# Patient Record
Sex: Male | Born: 1955 | Race: White | Hispanic: No | Marital: Married | Smoking: Never smoker
Health system: Southern US, Community
[De-identification: ages and names within clinical notes are randomized; demographics above are authoritative.]

## PROBLEM LIST (undated history)

## (undated) DIAGNOSIS — E785 Hyperlipidemia, unspecified: Secondary | ICD-10-CM

## (undated) DIAGNOSIS — I10 Essential (primary) hypertension: Secondary | ICD-10-CM

## (undated) DIAGNOSIS — I214 Non-ST elevation (NSTEMI) myocardial infarction: Secondary | ICD-10-CM

---

## 2018-02-15 DIAGNOSIS — I2510 Atherosclerotic heart disease of native coronary artery without angina pectoris: Secondary | ICD-10-CM

## 2018-02-15 HISTORY — DX: Atherosclerotic heart disease of native coronary artery without angina pectoris: I25.10

## 2018-03-06 DIAGNOSIS — I2510 Atherosclerotic heart disease of native coronary artery without angina pectoris: Secondary | ICD-10-CM

## 2018-03-16 ENCOUNTER — Other Ambulatory Visit: Payer: Self-pay

## 2018-03-16 ENCOUNTER — Encounter (HOSPITAL_COMMUNITY)
Admission: EM | Disposition: A | Discharge status: Home / Self Care | Payer: Self-pay | Source: Home / Self Care | Attending: Cardiovascular Disease

## 2018-03-16 ENCOUNTER — Emergency Department (HOSPITAL_COMMUNITY): Payer: Managed Care, Other (non HMO)

## 2018-03-16 ENCOUNTER — Inpatient Hospital Stay (HOSPITAL_COMMUNITY)
Admission: EM | Admit: 2018-03-16 | Discharge: 2018-03-18 | DRG: 247 | Disposition: A | Discharge status: Home / Self Care | Payer: Managed Care, Other (non HMO) | Attending: Cardiovascular Disease | Admitting: Cardiovascular Disease

## 2018-03-16 ENCOUNTER — Encounter (HOSPITAL_COMMUNITY): Payer: Self-pay | Admitting: Emergency Medicine

## 2018-03-16 DIAGNOSIS — Z7982 Long term (current) use of aspirin: Secondary | ICD-10-CM

## 2018-03-16 DIAGNOSIS — I249 Acute ischemic heart disease, unspecified: Secondary | ICD-10-CM

## 2018-03-16 DIAGNOSIS — I214 Non-ST elevation (NSTEMI) myocardial infarction: Secondary | ICD-10-CM | POA: Diagnosis present

## 2018-03-16 DIAGNOSIS — I25119 Atherosclerotic heart disease of native coronary artery with unspecified angina pectoris: Secondary | ICD-10-CM | POA: Diagnosis present

## 2018-03-16 DIAGNOSIS — I2510 Atherosclerotic heart disease of native coronary artery without angina pectoris: Secondary | ICD-10-CM

## 2018-03-16 DIAGNOSIS — Z955 Presence of coronary angioplasty implant and graft: Secondary | ICD-10-CM

## 2018-03-16 DIAGNOSIS — I10 Essential (primary) hypertension: Secondary | ICD-10-CM | POA: Diagnosis present

## 2018-03-16 DIAGNOSIS — E785 Hyperlipidemia, unspecified: Secondary | ICD-10-CM

## 2018-03-16 DIAGNOSIS — Z79899 Other long term (current) drug therapy: Secondary | ICD-10-CM | POA: Diagnosis not present

## 2018-03-16 DIAGNOSIS — E7849 Other hyperlipidemia: Secondary | ICD-10-CM | POA: Diagnosis not present

## 2018-03-16 HISTORY — DX: Non-ST elevation (NSTEMI) myocardial infarction: I21.4

## 2018-03-16 HISTORY — DX: Hyperlipidemia, unspecified: E78.5

## 2018-03-16 HISTORY — DX: Essential (primary) hypertension: I10

## 2018-03-16 HISTORY — PX: LEFT HEART CATH AND CORONARY ANGIOGRAPHY: CATH118249

## 2018-03-16 HISTORY — PX: CORONARY/GRAFT ACUTE MI REVASCULARIZATION: CATH118305

## 2018-03-16 LAB — PROTIME-INR
INR: 0.99
PROTHROMBIN TIME: 13 s (ref 11.4–15.2)

## 2018-03-16 LAB — CBC
HCT: 47.1 % (ref 39.0–52.0)
Hemoglobin: 15.9 g/dL (ref 13.0–17.0)
MCH: 31.4 pg (ref 26.0–34.0)
MCHC: 33.8 g/dL (ref 30.0–36.0)
MCV: 92.9 fL (ref 78.0–100.0)
Platelets: 288 10*3/uL (ref 150–400)
RBC: 5.07 MIL/uL (ref 4.22–5.81)
RDW: 12.2 % (ref 11.5–15.5)
WBC: 11.8 10*3/uL — AB (ref 4.0–10.5)

## 2018-03-16 LAB — BASIC METABOLIC PANEL
ANION GAP: 9 (ref 5–15)
BUN: 14 mg/dL (ref 6–20)
CALCIUM: 9.7 mg/dL (ref 8.9–10.3)
CO2: 25 mmol/L (ref 22–32)
Chloride: 102 mmol/L (ref 98–111)
Creatinine, Ser: 1.06 mg/dL (ref 0.61–1.24)
Glucose, Bld: 122 mg/dL — ABNORMAL HIGH (ref 70–99)
Potassium: 4.4 mmol/L (ref 3.5–5.1)
Sodium: 136 mmol/L (ref 135–145)

## 2018-03-16 LAB — APTT: aPTT: 28 seconds (ref 24–36)

## 2018-03-16 LAB — LIPID PANEL
Cholesterol: 241 mg/dL — ABNORMAL HIGH (ref 0–200)
HDL: 75 mg/dL (ref >40)
LDL CALC: 153 mg/dL — AB (ref 0–99)
Total CHOL/HDL Ratio: 3.2 RATIO
Triglycerides: 67 mg/dL (ref <150)
VLDL: 13 mg/dL (ref 0–40)

## 2018-03-16 LAB — TROPONIN I

## 2018-03-16 LAB — I-STAT TROPONIN, ED: TROPONIN I, POC: 4.26 ng/mL — AB (ref 0.00–0.08)

## 2018-03-16 LAB — MRSA PCR SCREENING: MRSA by PCR: NEGATIVE

## 2018-03-16 SURGERY — CORONARY/GRAFT ACUTE MI REVASCULARIZATION
Anesthesia: LOCAL

## 2018-03-16 MED ORDER — SODIUM CHLORIDE 0.9 % IV SOLN
INTRAVENOUS | Status: AC | PRN
  Administered 2018-03-16 (×2): 1.75 mg/kg/h via INTRAVENOUS

## 2018-03-16 MED ORDER — ONDANSETRON HCL 4 MG/2ML IJ SOLN: 4.0000 mg | Freq: Four times a day (QID) | INTRAMUSCULAR | Status: DC | PRN

## 2018-03-16 MED ORDER — VERAPAMIL HCL 2.5 MG/ML IV SOLN
INTRAVENOUS | Status: DC | PRN
  Administered 2018-03-16: 10 mL via INTRA_ARTERIAL

## 2018-03-16 MED ORDER — ATORVASTATIN CALCIUM 80 MG PO TABS
80.0000 mg | ORAL_TABLET | Freq: Every day | ORAL | Status: DC
  Administered 2018-03-17: 80 mg via ORAL
  Filled 2018-03-16: qty 1

## 2018-03-16 MED ORDER — LISINOPRIL 2.5 MG PO TABS
2.5000 mg | ORAL_TABLET | Freq: Every day | ORAL | Status: DC
  Administered 2018-03-17 – 2018-03-18 (×2): 2.5 mg via ORAL
  Filled 2018-03-16 (×2): qty 1

## 2018-03-16 MED ORDER — FENTANYL CITRATE (PF) 100 MCG/2ML IJ SOLN
INTRAMUSCULAR | Status: DC | PRN
  Administered 2018-03-16: 25 ug via INTRAVENOUS

## 2018-03-16 MED ORDER — BIVALIRUDIN BOLUS VIA INFUSION - CUPID
INTRAVENOUS | Status: DC | PRN
  Administered 2018-03-16: 62.925 mg via INTRAVENOUS

## 2018-03-16 MED ORDER — ATROPINE SULFATE 1 MG/10ML IJ SOSY
PREFILLED_SYRINGE | INTRAMUSCULAR | Status: DC | PRN
  Administered 2018-03-16: 0.5 mg via INTRAVENOUS

## 2018-03-16 MED ORDER — IOHEXOL 350 MG/ML SOLN
INTRAVENOUS | Status: DC | PRN
  Administered 2018-03-16: 65 mL via INTRA_ARTERIAL

## 2018-03-16 MED ORDER — NITROGLYCERIN 1 MG/10 ML FOR IR/CATH LAB
INTRA_ARTERIAL | Status: AC
  Filled 2018-03-16: qty 10

## 2018-03-16 MED ORDER — ASPIRIN 81 MG PO CHEW
324.0000 mg | CHEWABLE_TABLET | Freq: Once | ORAL | Status: AC
  Administered 2018-03-16: 324 mg via ORAL
  Filled 2018-03-16: qty 4

## 2018-03-16 MED ORDER — HEPARIN (PORCINE) IN NACL 1000-0.9 UT/500ML-% IV SOLN
INTRAVENOUS | Status: AC
  Filled 2018-03-16: qty 1000

## 2018-03-16 MED ORDER — HEPARIN (PORCINE) IN NACL 100-0.45 UNIT/ML-% IJ SOLN
1100.0000 [IU]/h | INTRAMUSCULAR | Status: DC
  Administered 2018-03-16: 1100 [IU]/h via INTRAVENOUS
  Filled 2018-03-16: qty 250

## 2018-03-16 MED ORDER — ACETAMINOPHEN 325 MG PO TABS: 650.0000 mg | ORAL_TABLET | ORAL | Status: DC | PRN

## 2018-03-16 MED ORDER — SODIUM CHLORIDE 0.9% FLUSH
3.0000 mL | Freq: Two times a day (BID) | INTRAVENOUS | Status: DC
  Administered 2018-03-17: 3 mL via INTRAVENOUS

## 2018-03-16 MED ORDER — MIDAZOLAM HCL 2 MG/2ML IJ SOLN
INTRAMUSCULAR | Status: DC | PRN
  Administered 2018-03-16: 2 mg via INTRAVENOUS

## 2018-03-16 MED ORDER — TICAGRELOR 90 MG PO TABS
90.0000 mg | ORAL_TABLET | Freq: Two times a day (BID) | ORAL | Status: DC
  Administered 2018-03-17 – 2018-03-18 (×3): 90 mg via ORAL
  Filled 2018-03-16 (×3): qty 1

## 2018-03-16 MED ORDER — SODIUM CHLORIDE 0.9 % IV SOLN
1.7500 mg/kg/h | INTRAVENOUS | Status: AC
  Filled 2018-03-16: qty 250

## 2018-03-16 MED ORDER — VERAPAMIL HCL 2.5 MG/ML IV SOLN
INTRAVENOUS | Status: AC
  Filled 2018-03-16: qty 2

## 2018-03-16 MED ORDER — ASPIRIN 81 MG PO CHEW
81.0000 mg | CHEWABLE_TABLET | Freq: Every day | ORAL | Status: DC
  Administered 2018-03-16 – 2018-03-18 (×3): 81 mg via ORAL
  Filled 2018-03-16 (×3): qty 1

## 2018-03-16 MED ORDER — LIDOCAINE HCL (PF) 1 % IJ SOLN
INTRAMUSCULAR | Status: AC
  Filled 2018-03-16: qty 30

## 2018-03-16 MED ORDER — SODIUM CHLORIDE 0.9 % IV SOLN
INTRAVENOUS | Status: DC
  Administered 2018-03-16: 20 mL/h via INTRAVENOUS

## 2018-03-16 MED ORDER — HEPARIN (PORCINE) IN NACL 2-0.9 UNITS/ML
INTRAMUSCULAR | Status: AC | PRN
  Administered 2018-03-16 (×2): 500 mL

## 2018-03-16 MED ORDER — LABETALOL HCL 5 MG/ML IV SOLN: 10.0000 mg | INTRAVENOUS | Status: AC | PRN

## 2018-03-16 MED ORDER — HYDRALAZINE HCL 20 MG/ML IJ SOLN: 5.0000 mg | INTRAMUSCULAR | Status: AC | PRN

## 2018-03-16 MED ORDER — SODIUM CHLORIDE 0.9 % IV SOLN: 250.0000 mL | INTRAVENOUS | Status: DC | PRN

## 2018-03-16 MED ORDER — SODIUM CHLORIDE 0.9 % IV SOLN
INTRAVENOUS | Status: DC
  Administered 2018-03-17: 06:00:00 via INTRAVENOUS

## 2018-03-16 MED ORDER — DIAZEPAM 5 MG PO TABS: 5.0000 mg | ORAL_TABLET | Freq: Four times a day (QID) | ORAL | Status: DC | PRN

## 2018-03-16 MED ORDER — BIVALIRUDIN TRIFLUOROACETATE 250 MG IV SOLR
INTRAVENOUS | Status: AC
  Filled 2018-03-16: qty 250

## 2018-03-16 MED ORDER — IOPAMIDOL (ISOVUE-370) INJECTION 76%
INTRAVENOUS | Status: DC | PRN
  Administered 2018-03-16: 125 mL via INTRA_ARTERIAL

## 2018-03-16 MED ORDER — HEPARIN BOLUS VIA INFUSION
4000.0000 [IU] | Freq: Once | INTRAVENOUS | Status: AC
Start: 2018-03-16 — End: 2018-03-16
  Administered 2018-03-16: 4000 [IU] via INTRAVENOUS
  Filled 2018-03-16: qty 4000

## 2018-03-16 MED ORDER — FENTANYL CITRATE (PF) 100 MCG/2ML IJ SOLN
INTRAMUSCULAR | Status: AC
  Filled 2018-03-16: qty 2

## 2018-03-16 MED ORDER — MIDAZOLAM HCL 2 MG/2ML IJ SOLN
INTRAMUSCULAR | Status: AC
  Filled 2018-03-16: qty 2

## 2018-03-16 MED ORDER — LIDOCAINE HCL (PF) 1 % IJ SOLN
INTRAMUSCULAR | Status: DC | PRN
  Administered 2018-03-16: 5 mL via SUBCUTANEOUS

## 2018-03-16 MED ORDER — NITROGLYCERIN 1 MG/10 ML FOR IR/CATH LAB
INTRA_ARTERIAL | Status: DC | PRN
  Administered 2018-03-16: 100 ug

## 2018-03-16 MED ORDER — NITROGLYCERIN IN D5W 200-5 MCG/ML-% IV SOLN
0.0000 ug/min | Freq: Once | INTRAVENOUS | Status: AC
  Administered 2018-03-16: 5 ug/min via INTRAVENOUS
  Filled 2018-03-16: qty 250

## 2018-03-16 MED ORDER — SODIUM CHLORIDE 0.9% FLUSH: 3.0000 mL | INTRAVENOUS | Status: DC | PRN

## 2018-03-16 MED ORDER — NITROGLYCERIN 0.4 MG SL SUBL: 0.4000 mg | SUBLINGUAL_TABLET | SUBLINGUAL | Status: DC | PRN

## 2018-03-16 MED ORDER — TICAGRELOR 90 MG PO TABS
ORAL_TABLET | ORAL | Status: DC | PRN
  Administered 2018-03-16: 180 mg via ORAL

## 2018-03-16 MED ORDER — TICAGRELOR 90 MG PO TABS
ORAL_TABLET | ORAL | Status: AC
  Filled 2018-03-16: qty 2

## 2018-03-16 SURGICAL SUPPLY — 17 items
BALLN SAPPHIRE 2.0X12 (BALLOONS) ×2
BALLN ~~LOC~~ EMERGE MR 2.75X12 (BALLOONS) ×2
BALLOON SAPPHIRE 2.0X12 (BALLOONS) ×1 IMPLANT
BALLOON ~~LOC~~ EMERGE MR 2.75X12 (BALLOONS) ×1 IMPLANT
CATH OPTITORQUE TIG 4.0 5F (CATHETERS) ×2 IMPLANT
CATH VISTA GUIDE 6FR JR4 (CATHETERS) ×2 IMPLANT
DEVICE RAD COMP TR BAND LRG (VASCULAR PRODUCTS) ×2 IMPLANT
GLIDESHEATH SLEND SS 6F .021 (SHEATH) ×2 IMPLANT
GUIDEWIRE INQWIRE 1.5J.035X260 (WIRE) ×1 IMPLANT
INQWIRE 1.5J .035X260CM (WIRE) ×2
KIT ENCORE 26 ADVANTAGE (KITS) ×2 IMPLANT
KIT HEART LEFT (KITS) ×2 IMPLANT
PACK CARDIAC CATHETERIZATION (CUSTOM PROCEDURE TRAY) ×2 IMPLANT
STENT RESOLUTE ONYX 2.5X15 (Permanent Stent) ×2 IMPLANT
TRANSDUCER W/STOPCOCK (MISCELLANEOUS) ×2 IMPLANT
TUBING CIL FLEX 10 FLL-RA (TUBING) ×2 IMPLANT
WIRE PT2 MS 185 (WIRE) ×2 IMPLANT

## 2018-03-16 NOTE — ED Notes (Signed)
CRITICAL I-STAT RESULTS :   I-stat Troponin: 4.26  Resulted to: Rubin PayorPickering, MD

## 2018-03-16 NOTE — Progress Notes (Signed)
Chaplain responded to A code Stemi in the ED.  The patient is being evaluated by the medical staff at this time. Family is present. Chaplain will follow up as needed. Chaplain Janell QuietAudrey Vincentina Sollers [phone]

## 2018-03-16 NOTE — ED Triage Notes (Signed)
Pt reports intermittent chest pain for several days with activity. Also has shortness of breath that comes and goes. Pt states when he has been exercising he has been yawning which is not normal for him. Pt also reports one episode of body chills. Pt endorses "abnormal random cough" today as well.

## 2018-03-16 NOTE — ED Provider Notes (Signed)
[facility] Provider Note   CSN: [id number] Arrival date & time: 03/16/18  1646     History   Chief Complaint Chief Complaint  Patient presents with  . Chest Pain    HPI Elijah Baker is a 62 y.o. male.  HPI Patient presents with chest pain.  Has been going for most of the day today.  It is dull and tight in his left chest.  Came on when he got up and walked this morning.  Was here for hours and improved a little with rest but has returned.  He is an avid bicyclist and over the last week has not been able to bike the same.  Has some mild chest pain would come on with it and he was not able to do the same level of exertion he normally would.  Rare cough.  No swelling in his legs.  No history of cardiac disease.  Continued pain now.  Took Motrin without relief. Past Medical History:  Diagnosis Date  . Hypertension     There are no active problems to display for this patient.         Home Medications    Prior to Admission medications   Not on File    Family History No family history on file.  Social History Social History   Tobacco Use  . Smoking status: Not on file  Substance Use Topics  . Alcohol use: Not on file  . Drug use: Not on file     Allergies   Azithromycin; Bee venom; Ciprofloxacin; and Metronidazole   Review of Systems Review of Systems  Constitutional: Negative for appetite change.  HENT: Negative for congestion.   Respiratory: Positive for shortness of breath.   Cardiovascular: Positive for chest pain. Negative for leg swelling.  Gastrointestinal: Negative for abdominal pain.  Genitourinary: Negative for flank pain.  Musculoskeletal: Negative for back pain.  Skin: Negative for rash.  Neurological: Negative for syncope.  Hematological: Negative for adenopathy.     Physical Exam Updated Vital Signs BP 109/64 (BP Location: Right Arm)   Pulse (!) 50   Temp 98.4 F (36.9 C)   Resp (!) 9    Ht 6\' 2"  (1.88 m)   Wt 83.9 kg (185 lb)   SpO2 95%   BMI 23.75 kg/m   Physical Exam  Constitutional: He appears well-developed.  HENT:  Head: Normocephalic.  Eyes: EOM are normal.  Cardiovascular: Normal rate, regular rhythm and normal pulses.  Pulmonary/Chest: Effort normal.  Abdominal: Soft. There is no tenderness.  Musculoskeletal:       Right lower leg: He exhibits no edema.       Left lower leg: He exhibits no edema.  Skin: Skin is warm. Capillary refill takes less than 2 seconds.     ED Treatments / Results  Labs (all labs ordered are listed, but only abnormal results are displayed) Labs Reviewed  BASIC METABOLIC PANEL - Abnormal; Notable for the following components:      Result Value   Glucose, Bld 122 (*)    All other components within normal limits  CBC - Abnormal; Notable for the following components:   WBC 11.8 (*)    All other components within normal limits  LIPID PANEL - Abnormal; Notable for the following components:   Cholesterol 241 (*)    LDL Cholesterol 153 (*)    All other components within normal limits  I-STAT TROPONIN, ED - Abnormal; Notable for the following components:  Troponin i, poc 4.26 (*)    All other components within normal limits  PROTIME-INR  APTT  HEPARIN LEVEL (UNFRACTIONATED)  CBC  I-STAT BETA HCG BLOOD, ED ([facility], [facility], [facility] ONLY)    EKG EKG Interpretation  Date/Time:  Sunday March 16 2018 16:49:31 EDT Ventricular Rate:  63 PR Interval:  170 QRS Duration: 88 QT Interval:  372 QTC Calculation: 380 R Axis:   72 Text Interpretation:  Normal sinus rhythm Possible Anterior infarct , age undetermined T wave abnormality, consider inferolateral ischemia Abnormal ECG No old tracing to compare Confirmed by Benjiman Core [phone]) on 03/16/2018 5:28:24 PM   Radiology Dg Chest 2 View  Result Date: 03/16/2018 CLINICAL DATA:  Intermittent chest pain for the past few days with shortness of breath. EXAM: CHEST - 2 VIEW COMPARISON:   None. FINDINGS: The heart size and mediastinal contours are within normal limits. Both lungs are clear. The visualized skeletal structures are unremarkable. IMPRESSION: No active cardiopulmonary disease. Electronically Signed   By: Obie Dredge M.D.   On: 03/16/2018 17:48    Procedures Procedures (including critical care time)  Medications Ordered in ED Medications  0.9 %  sodium chloride infusion (20 mL/hr Intravenous New Bag/Given 03/16/18 1810)  nitroGLYCERIN (NITROSTAT) SL tablet 0.4 mg (has no administration in time range)  heparin ADULT infusion 100 units/mL (25000 units/230mL sodium chloride 0.45%) (1,100 Units/hr Intravenous New Bag/Given 03/16/18 1844)  aspirin chewable tablet 324 mg (324 mg Oral Given 03/16/18 1814)  nitroGLYCERIN 50 mg in dextrose 5 % 250 mL (0.2 mg/mL) infusion (30 mcg/min Intravenous Rate/Dose Change 03/16/18 1905)  heparin bolus via infusion 4,000 Units (4,000 Units Intravenous Bolus from Bag 03/16/18 1842)     Initial Impression / Assessment and Plan / ED Course  I have reviewed the triage vital signs and the nursing notes.  Pertinent labs & imaging results that were available during my care of the patient were reviewed by me and considered in my medical decision making (see chart for details).     Patient with chest pain.  Story worrisome for an unstable angina but then his troponin came back at 4.  Discussed with cardiology, who came to see the patient.  Continued pain with nitroglycerin and aspirin.  EKG stable on repeat.  Will likely require either urgent or emergent heart catheterization.  CRITICAL CARE Performed by: Benjiman Core Total critical care time: 30 minutes Critical care time was exclusive of separately billable procedures and treating other patients. Critical care was necessary to treat or prevent imminent or life-threatening deterioration. Critical care was time spent personally by me on the following activities: development of  treatment plan with patient and/or surrogate as well as nursing, discussions with consultants, evaluation of patient's response to treatment, examination of patient, obtaining history from patient or surrogate, ordering and performing treatments and interventions, ordering and review of laboratory studies, ordering and review of radiographic studies, pulse oximetry and re-evaluation of patient's condition.   Final Clinical Impressions(s) / ED Diagnoses   Final diagnoses:  NSTEMI (non-ST elevated myocardial infarction) [facility])    ED Discharge Orders    None       Benjiman Core, MD 03/16/18 1916

## 2018-03-16 NOTE — Progress Notes (Signed)
ANTICOAGULATION CONSULT NOTE - Initial Consult  Pharmacy Consult for heparin Indication: chest pain/ACS  Allergies  Allergen Reactions  . Azithromycin   . Bee Venom   . Ciprofloxacin   . Metronidazole     Patient Measurements: Height: 6\' 2"  (188 cm) Weight: 185 lb (83.9 kg) IBW/kg (Calculated) : 77.7 Heparin Dosing Weight: 83.9 kg  Vital Signs: Temp: 98.4 F (36.9 C) (06/30 1658) BP: 159/81 (06/30 1658) Pulse Rate: 64 (06/30 1658)  Labs: Recent Labs    03/16/18 1713  HGB 15.9  HCT 47.1  PLT 288    Assessment: Heparin gtt for rule out ACS Pt admitted with intermittent chest pain and SOB Trop 4.2 SCr 1   Goal of Therapy:  Heparin level 0.3-0.7 units/ml Monitor platelets by anticoagulation protocol: Yes    Plan:  -Heparin 4000 units x1 then 1100 units/hr -Daily HL, CBC -First level with AM labs   Elijah Baker, Elijah HouseholderAlison M 03/16/2018,5:52 PM

## 2018-03-16 NOTE — H&P (Addendum)
History and Physical  Primary Cardiologist: N/A PCP: No primary care provider on file.  Chief Complaint: Chest Pain  HPI:  This is a 62 year old male without significant past medical history other than hypertension who presents with chest pain.  He is an avid bike rider, riding multiple times a week.  Over the last few weeks he is noticed progressive decrease in exercise tolerance.  This morning at 6 AM he woke up and while walking to get coffee noticed excruciating acute onset chest pain.  Throughout the entire day it has increased and decreased in severity but never completely left.  He presented to the [facility] ED this afternoon/evening and had inferior T wave inversions on his EKG.  First troponin was roughly 5.  Normal kidney function.  Upon interview he continues to have angina.  This has continued despite uptitrating nitroglycerin infusion to 35 mcg.  He is status post heparin and full strength aspirin.  No significant family medical history of cardiovascular disease.  He is a non-smoker.  No associated review of systems which are positive including nausea vomiting, shortness of breath, bleeding or bruising troubles or any other significant findings.  Prior Cardiac Studies:  Past Medical History:  Diagnosis Date  . Hypertension     History reviewed. No pertinent surgical history.  History reviewed. No pertinent family history. Social History:  has no tobacco, alcohol, and drug history on file.  Allergies:  Allergies  Allergen Reactions  . Azithromycin   . Bee Venom   . Ciprofloxacin   . Metronidazole     No current facility-administered medications on file prior to encounter.    No current outpatient medications on file prior to encounter.    _0 @ _1 @  Results for orders placed or performed during the hospital encounter of 03/16/18 (from the past 48 hour(s))  Basic metabolic panel     Status: Abnormal   Collection Time: 03/16/18  5:13 PM    Result Value Ref Range   Sodium 136 135 - 145 mmol/L   Potassium 4.4 3.5 - 5.1 mmol/L   Chloride 102 98 - 111 mmol/L    Comment: Please note change in reference range.   CO2 25 22 - 32 mmol/L   Glucose, Bld 122 (H) 70 - 99 mg/dL    Comment: Please note change in reference range.   BUN 14 6 - 20 mg/dL    Comment: Please note change in reference range.   Creatinine, Ser 1.06 0.61 - 1.24 mg/dL   Calcium 9.7 8.9 - 10.3 mg/dL   GFR calc non Af Amer >60 >60 mL/min   GFR calc Af Amer >60 >60 mL/min    Comment: (NOTE) The eGFR has been calculated using the CKD EPI equation. This calculation has not been validated in all clinical situations. eGFR's persistently <60 mL/min signify possible Chronic Kidney Disease.    Anion gap 9 5 - 15    Comment: Performed at [facility] [address], [place], [place] [address]  CBC     Status: Abnormal   Collection Time: 03/16/18  5:13 PM  Result Value Ref Range   WBC 11.8 (H) 4.0 - 10.5 K/uL   RBC 5.07 4.22 - 5.81 MIL/uL   Hemoglobin 15.9 13.0 - 17.0 g/dL   HCT 47.1 39.0 - 52.0 %   MCV 92.9 78.0 - 100.0 fL   MCH 31.4 26.0 - 34.0 pg   MCHC 33.8 30.0 - 36.0 g/dL   RDW 12.2 11.5 - 15.5 %  Platelets 288 150 - 400 K/uL    Comment: Performed at [facility], [place] [address], [place], [place] [address]  I-stat troponin, ED     Status: Abnormal   Collection Time: 03/16/18  5:26 PM  Result Value Ref Range   Troponin i, poc 4.26 (HH) 0.00 - 0.08 ng/mL   Comment NOTIFIED PHYSICIAN    Comment 3            Comment: Due to the release kinetics of cTnI, a negative result within the first hours of the onset of symptoms does not rule out myocardial infarction with certainty. If myocardial infarction is still suspected, repeat the test at appropriate intervals.   Protime-INR     Status: None   Collection Time: 03/16/18  5:53 PM  Result Value Ref Range   Prothrombin Time 13.0 11.4 - 15.2 seconds   INR 0.99     Comment: Performed at [facility], [place] [address] [place], [place] [address]  APTT     Status: None   Collection Time: 03/16/18  5:53 PM  Result Value Ref Range   aPTT 28 24 - 36 seconds    Comment: Performed at [facility] [address], [place], [place] [address]  Lipid panel     Status: Abnormal   Collection Time: 03/16/18  5:53 PM  Result Value Ref Range   Cholesterol 241 (H) 0 - 200 mg/dL   Triglycerides 67 <150 mg/dL   HDL 75 >40 mg/dL   Total CHOL/HDL Ratio 3.2 RATIO   VLDL 13 0 - 40 mg/dL   LDL Cholesterol 153 (H) 0 - 99 mg/dL    Comment:        Total Cholesterol/HDL:CHD Risk Coronary Heart Disease Risk Table                     Men   Women  1/2 Average Risk   3.4   3.3  Average Risk       5.0   4.4  2 X Average Risk   9.6   7.1  3 X Average Risk  23.4   11.0        Use the calculated Patient Ratio above and the CHD Risk Table to determine the patient's CHD Risk.        ATP III CLASSIFICATION (LDL):  <100     mg/dL   Optimal  100-129  mg/dL   Near or Above                    Optimal  130-159  mg/dL   Borderline  160-189  mg/dL   High  >190     mg/dL   Very High Performed at [facility] [address], [place], [place] [address]    Dg Chest 2 View  Result Date: 03/16/2018 CLINICAL DATA:  Intermittent chest pain for the past few days with shortness of breath. EXAM: CHEST - 2 VIEW COMPARISON:  None. FINDINGS: The heart size and mediastinal contours are within normal limits. Both lungs are clear. The visualized skeletal structures are unremarkable. IMPRESSION: No active cardiopulmonary disease. Electronically Signed   By: Titus Dubin M.D.   On: 03/16/2018 17:48    ECG/Tele: NSR with TWI inferolaterally  ROS: As above. Otherwise, review of systems is negative unless per above HPI  Vitals:   03/16/18 1658 03/16/18 1821 03/16/18 1829 03/16/18 1855  BP: (!) 159/81 (!) 160/83 Marland Kitchen)  146/88 109/64  Pulse: 64 (!) 58 62 (!) 50  Resp: _0 (!) 9  Temp: 98.4 F (36.9  C)     SpO2: 99% 100% 96% 95%  Weight:      Height:       Wt Readings from Last 10 Encounters:  03/16/18 83.9 kg (185 lb)    PE:  General: No acute distress HEENT: Atraumatic, EOMI, mucous membranes moist. No JVD at 45 degrees. No HJR. CV: RRR no murmurs, gallops.  Respiratory: Clear, no crackles. Normal work of breathing ABD: Non-distended and non-tender. No palpable organomegaly.  Extremities: 2+ radial pulses bilaterally. no edema. Neuro/Psych: CN grossly intact, alert and oriented  Assessment/Plan NSTEMI, high risk HTN  NSTEMI --pain has been refractory to IV nitroglycerin infusion.  Most recent blood pressure is 938 systolic and he continues to have angina.  I have discussed the case with interventional on-call team who will come in to perform an urgent heart catheterization. - S/P 325 ASA, IV UF Heparin - TTE - Tele bed, step down v ICU pending cath findings - AM lipids - ? Beta Blocker pending hemodynamics -  Likely Atorva 80  HTN - Likely additional BB as above, continue home BP meds  Full Code  Lolita Cram Means  MD 03/16/2018, 7:27 PM    Patient seen and examined. Agree with assessment and plan.  Elijah Baker is a very pleasant Caucasian male who has a history of hypertension and borderline mild hyperlipidemia.  Remotely he had taken statins but primary physician stopped these approximately 5 years ago.  The patient exercises regularly.  He is an avid cyclist and bikes hard at least 3 to 4 days/week.  This past week was the first time he began to notice some mild shortness of breath during some of his hard bike rides which he had never experienced before.  This morning at approximately 6 AM he developed new onset chest discomfort.  His chest pain persisted and waxed and waned in intensity throughout the day but ultimately continue to persist leading to his emergency room evaluation.  Initial ECG shows inferolateral T wave abnormality suggestive of  inferolateral ischemia.  Initial troponin is positive at 4.26.  He has continued to experience chest pain in the emergency room despite increasing doses of nitroglycerin and he was started on heparin.  Due to persistent discomfort he is now brought urgently to the cardiac catheterization laboratory.  Appears euvolemic on exam with no overt signs of heart failure.  He is mildly bradycardic still has residual 3/10 chest discomfort.  I discussed the catheterization procedure and high likelihood for intervention with the patient who agrees to undergo the procedure.  Troy Sine, MD, [facility] 03/16/2018 10:20 PM

## 2018-03-16 NOTE — Progress Notes (Signed)
CRITICAL VALUE ALERT  Critical Value:  Trop I > 65  Date & Time Notied:  t 2355  Provider Notified: Dr Charlestine NightMeans Cards fellow  Orders Received/Actions taken: No orders at this time

## 2018-03-17 ENCOUNTER — Inpatient Hospital Stay (HOSPITAL_COMMUNITY): Payer: Managed Care, Other (non HMO)

## 2018-03-17 ENCOUNTER — Other Ambulatory Visit: Payer: Self-pay | Admitting: Interventional Cardiology

## 2018-03-17 ENCOUNTER — Encounter (HOSPITAL_COMMUNITY): Payer: Self-pay | Admitting: Cardiovascular Disease

## 2018-03-17 DIAGNOSIS — I2510 Atherosclerotic heart disease of native coronary artery without angina pectoris: Secondary | ICD-10-CM

## 2018-03-17 DIAGNOSIS — I214 Non-ST elevation (NSTEMI) myocardial infarction: Secondary | ICD-10-CM

## 2018-03-17 DIAGNOSIS — E785 Hyperlipidemia, unspecified: Secondary | ICD-10-CM

## 2018-03-17 DIAGNOSIS — I10 Essential (primary) hypertension: Secondary | ICD-10-CM

## 2018-03-17 LAB — BASIC METABOLIC PANEL
Anion gap: 9 (ref 5–15)
BUN: 10 mg/dL (ref 6–20)
CALCIUM: 8.8 mg/dL — AB (ref 8.9–10.3)
CHLORIDE: 105 mmol/L (ref 98–111)
CO2: 25 mmol/L (ref 22–32)
CREATININE: 1.07 mg/dL (ref 0.61–1.24)
GFR calc Af Amer: >60 mL/min (ref >60)
GFR calc non Af Amer: >60 mL/min (ref >60)
Glucose, Bld: 112 mg/dL — ABNORMAL HIGH (ref 70–99)
Potassium: 4.2 mmol/L (ref 3.5–5.1)
SODIUM: 139 mmol/L (ref 135–145)

## 2018-03-17 LAB — CBC
HEMATOCRIT: 40.9 % (ref 39.0–52.0)
HEMOGLOBIN: 13.9 g/dL (ref 13.0–17.0)
MCH: 31.7 pg (ref 26.0–34.0)
MCHC: 34 g/dL (ref 30.0–36.0)
MCV: 93.4 fL (ref 78.0–100.0)
Platelets: 228 10*3/uL (ref 150–400)
RBC: 4.38 MIL/uL (ref 4.22–5.81)
RDW: 12.3 % (ref 11.5–15.5)
WBC: 11.1 10*3/uL — ABNORMAL HIGH (ref 4.0–10.5)

## 2018-03-17 LAB — ECHOCARDIOGRAM COMPLETE
HEIGHTINCHES: 74 in
WEIGHTICAEL: 2960 [oz_av]

## 2018-03-17 LAB — HEPATIC FUNCTION PANEL
ALBUMIN: 3.5 g/dL (ref 3.5–5.0)
ALK PHOS: 35 U/L — AB (ref 38–126)
ALT: 50 U/L — ABNORMAL HIGH (ref 0–44)
AST: 194 U/L — ABNORMAL HIGH (ref 15–41)
BILIRUBIN INDIRECT: 0.8 mg/dL (ref 0.3–0.9)
BILIRUBIN TOTAL: 1 mg/dL (ref 0.3–1.2)
Bilirubin, Direct: 0.2 mg/dL (ref 0.0–0.2)
TOTAL PROTEIN: 5.4 g/dL — AB (ref 6.5–8.1)

## 2018-03-17 LAB — POCT ACTIVATED CLOTTING TIME: Activated Clotting Time: 625 seconds

## 2018-03-17 MED FILL — Heparin Sod (Porcine)-NaCl IV Soln 1000 Unit/500ML-0.9%: INTRAVENOUS | Qty: 500 | Status: AC

## 2018-03-17 NOTE — Progress Notes (Signed)
Per insurance check on Brilinta  Co-pay amount for Brilinta 90 mg.BID is: $25.00 for 30 day supply.  No PA required  Pharmacies are : [facility], [facility].

## 2018-03-17 NOTE — Plan of Care (Signed)
  Problem: Clinical Measurements: Goal: Will remain free from infection Outcome: Progressing   Problem: Clinical Measurements: Goal: Respiratory complications will improve Outcome: Progressing   Problem: Activity: Goal: Risk for activity intolerance will decrease Outcome: Progressing   Problem: Nutrition: Goal: Adequate nutrition will be maintained Outcome: Progressing   Problem: Coping: Goal: Level of anxiety will decrease Outcome: Progressing   Problem: Elimination: Goal: Will not experience complications related to urinary retention Outcome: Progressing   Problem: Pain Managment: Goal: General experience of comfort will improve Outcome: Progressing   Problem: Safety: Goal: Ability to remain free from injury will improve Outcome: Progressing

## 2018-03-17 NOTE — Progress Notes (Signed)
  Echocardiogram 2D Echocardiogram has been performed.  Elijah Baker 03/17/2018, 10:30 AM

## 2018-03-17 NOTE — Progress Notes (Signed)
CARDIAC REHAB PHASE I   PRE:  Rate/Rhythm: 70 SR  BP:  Supine: 125/70  Sitting:   Standing:    SaO2: 96%RA  MODE:  Ambulation: 370 ft   POST:  Rate/Rhythm: 74 SR  BP:  Supine:   Sitting: 115/67  Standing:    SaO2: 95%RA 1045-1140 Pt walked 370 ft on RA with steady gait and no CP. Tolerated well. MI education completed with pt and wife who voiced understanding. Stressed importance of brilinta with stent. Needs to see case manager. Reviewed MI restrictions, NTG use, heart healthy diet, ex ed and CRP 2. Gave brochure for [place] and made referral here as pt works in [employer].   Luetta Nuttingharlene Damyn Weitzel, RN BSN  03/17/2018 11:37 AM

## 2018-03-17 NOTE — Progress Notes (Signed)
[facility]  Patient arrived via w/c.  Ambulating in room, denies chest pain.  Right radial site level 0.  Oriented to unit and plan of care.

## 2018-03-17 NOTE — Progress Notes (Addendum)
Progress Note  Patient Name: Elijah Baker Date of Encounter: 03/17/2018  Primary Cardiologist: Nicki Guadalajara  Subjective   Feels better this morning.  Denies chest discomfort.  No nausea vomiting.  Denies shortness of breath.  1 to 2-week history of dyspnea and chest tightness while biking.  Otherwise no significant history.  Inpatient Medications    Scheduled Meds: . aspirin  81 mg Oral Daily  . atorvastatin  80 mg Oral q1800  . lisinopril  2.5 mg Oral Daily  . sodium chloride flush  3 mL Intravenous Q12H  . ticagrelor  90 mg Oral BID   Continuous Infusions: . sodium chloride 150 mL/hr at 03/16/18 2125  . sodium chloride 150 mL/hr at 03/17/18 0534  . sodium chloride     PRN Meds: sodium chloride, acetaminophen, diazepam, nitroGLYCERIN, ondansetron (ZOFRAN) IV, sodium chloride flush   Vital Signs    Vitals:   03/17/18 0717 03/17/18 0800 03/17/18 0900 03/17/18 1000  BP:   109/68 115/66  Pulse:  61 (!) 58 (!) 58  Resp:  (!) 21 (!) 22 15  Temp: 98.8 F (37.1 C)     TempSrc: Oral     SpO2:  99% 99% 100%  Weight:      Height:        Intake/Output Summary (Last 24 hours) at 03/17/2018 1128 Last data filed at 03/17/2018 1000 Gross per 24 hour  Intake 3413.14 ml  Output 1950 ml  Net 1463.14 ml   Filed Weights   03/16/18 1656  Weight: 185 lb (83.9 kg)    Telemetry    Sinus rhythm with PACs.- Personally Reviewed  ECG    Sinus bradycardia with small inferior Q waves suggesting possible infarction inferiorly.  Normalization of T wave inversion previously noted.- Personally Reviewed  Physical Exam  Well-developed well-nourished in no acute distress GEN: No acute distress.   Neck: No JVD Cardiac: RRR, no murmurs, rubs, or gallops.  Respiratory: Clear to auscultation bilaterally. GI: Soft, nontender, non-distended  MS: No edema; No deformity. Neuro:  Nonfocal  Psych: Normal affect   Labs    Chemistry Recent Labs  Lab 03/16/18 1713 03/17/18 0702    NA 136 139  K 4.4 4.2  CL 102 105  CO2 25 25  GLUCOSE 122* 112*  BUN 14 10  CREATININE 1.06 1.07  CALCIUM 9.7 8.8*  PROT  --  5.4*  ALBUMIN  --  3.5  AST  --  194*  ALT  --  50*  ALKPHOS  --  35*  BILITOT  --  1.0  GFRNONAA >60 >60  GFRAA >60 >60  ANIONGAP 9 9     Hematology Recent Labs  Lab 03/16/18 1713 03/17/18 0702  WBC 11.8* 11.1*  RBC 5.07 4.38  HGB 15.9 13.9  HCT 47.1 40.9  MCV 92.9 93.4  MCH 31.4 31.7  MCHC 33.8 34.0  RDW 12.2 12.3  PLT 288 228    Cardiac Enzymes Recent Labs  Lab 03/16/18 2222  TROPONINI >65.00*    Recent Labs  Lab 03/16/18 1726  TROPIPOC 4.26*     BNPNo results for input(s): BNP, PROBNP in the last 168 hours.   DDimer No results for input(s): DDIMER in the last 168 hours.   Radiology    Dg Chest 2 View  Result Date: 03/16/2018 CLINICAL DATA:  Intermittent chest pain for the past few days with shortness of breath. EXAM: CHEST - 2 VIEW COMPARISON:  None. FINDINGS: The heart size and mediastinal contours are  within normal limits. Both lungs are clear. The visualized skeletal structures are unremarkable. IMPRESSION: No active cardiopulmonary disease. Electronically Signed   By: Obie DredgeWilliam T Derry M.D.   On: 03/16/2018 17:48    Cardiac Studies   Cardiac catheterization 03/16/2018: Coronary Diagrams   Diagnostic Diagram       Post-Intervention Diagram        2D Doppler echocardiogram 03/17/2018: ------------------------------------------------------------------- Study Conclusions   - Left ventricle: The cavity size was normal. Wall thickness was   normal. Systolic function was normal. The estimated ejection   fraction was in the range of 50% to 55%. Wall motion was normal;   there were no regional wall motion abnormalities. - Left atrium: The atrium was moderately dilated. - Right atrium: The atrium was mildly dilated.   Patient Profile     62 y.o. male with no significant prior medical history other than  hypertension who presented with an acute inferior ST elevation MI.  Pain started at 6 AM but he did not report to the emergency room until 4:39 PM pM.  He was treated with angioplasty and stent implantation in the native right coronary.  Assessment & Plan    1. Acute inferior ST segment elevation MI, late presenting.  Excessively treated with RCA PCI and stent implantation.  The occluded right coronary was noted to have left to right collaterals. 2. History of hypertension, currently with relatively soft blood pressures.  Target blood pressure less than 130/80 mmHg. 3. Presumed hyperlipidemia.  Target LDL less than 70.  High intensity statin therapy will be initiated.  Documented LDL 153. 4. Elevated liver enzymes, uncertain etiology.  Still unable to initiate beta-blocker therapy because of soft blood pressures and relative bradycardia.  Echo is pending.  Phase 1 cardiac rehab will be instituted.  Ambulate and transfer to telemetry.  For questions or updates, please contact [facility] Please consult [email or web address] for contact info under Cardiology/STEMI.      Signed, Lesleigh NoeHenry W Emily Massar III, MD  03/17/2018, 11:28 AM

## 2018-03-18 ENCOUNTER — Telehealth: Payer: Self-pay | Admitting: Physician Assistant

## 2018-03-18 ENCOUNTER — Other Ambulatory Visit: Payer: Self-pay | Admitting: Interventional Cardiology

## 2018-03-18 ENCOUNTER — Encounter (HOSPITAL_COMMUNITY): Payer: Self-pay | Admitting: Cardiology

## 2018-03-18 DIAGNOSIS — Z955 Presence of coronary angioplasty implant and graft: Secondary | ICD-10-CM

## 2018-03-18 DIAGNOSIS — E785 Hyperlipidemia, unspecified: Secondary | ICD-10-CM

## 2018-03-18 DIAGNOSIS — E7849 Other hyperlipidemia: Secondary | ICD-10-CM

## 2018-03-18 LAB — CBC
HEMATOCRIT: 39.2 % (ref 39.0–52.0)
Hemoglobin: 13.2 g/dL (ref 13.0–17.0)
MCH: 32.1 pg (ref 26.0–34.0)
MCHC: 33.7 g/dL (ref 30.0–36.0)
MCV: 95.4 fL (ref 78.0–100.0)
PLATELETS: 213 10*3/uL (ref 150–400)
RBC: 4.11 MIL/uL — ABNORMAL LOW (ref 4.22–5.81)
RDW: 12.6 % (ref 11.5–15.5)
WBC: 8.6 10*3/uL (ref 4.0–10.5)

## 2018-03-18 MED ORDER — TICAGRELOR 90 MG PO TABS: 90.0000 mg | ORAL_TABLET | Freq: Two times a day (BID) | ORAL | 11 refills | Status: DC

## 2018-03-18 MED ORDER — LISINOPRIL 2.5 MG PO TABS: 2.5000 mg | ORAL_TABLET | Freq: Every day | ORAL | 2 refills | Status: DC

## 2018-03-18 MED ORDER — ATORVASTATIN CALCIUM 80 MG PO TABS: 80.0000 mg | ORAL_TABLET | Freq: Every day | ORAL | 2 refills | Status: DC

## 2018-03-18 MED ORDER — NITROGLYCERIN 0.4 MG SL SUBL: 0.4000 mg | SUBLINGUAL_TABLET | SUBLINGUAL | 0 refills | Status: DC | PRN

## 2018-03-18 MED ORDER — ASPIRIN 81 MG PO CHEW: 81.0000 mg | CHEWABLE_TABLET | Freq: Every day | ORAL | Status: AC

## 2018-03-18 NOTE — Telephone Encounter (Signed)
-----   Message from Leotis PainMarilyn K Fogleman sent at 03/18/2018  9:17 AM EDT ----- Regarding: FW: TOC f/u call    ----- Message ----- From: Arty Baumgartneroberts, Lindsay B, NP Sent: 03/18/2018   9:13 AM To: [facility] Subject: TOC f/u call                                   Pt needs a TOC f/u call. Thanks

## 2018-03-18 NOTE — Telephone Encounter (Signed)
Patient discharged today 7/2 First TOC outreach attempt should be 03/19/18 Patient has f/up 03/27/18 @ 10am with Wynema BirchHao, [place]

## 2018-03-18 NOTE — Discharge Summary (Signed)
Discharge Summary    Patient ID: Elijah Baker,  MRN: [id number], DOB/AGE: February 18, 1956 62 y.o.  Admit date: 03/16/2018 Discharge date: 03/18/2018  Primary Care Provider: No primary care provider on file. Primary Cardiologist: Dr. Tresa Endo  Discharge Diagnoses    Active Problems:   NSTEMI (non-ST elevated myocardial infarction) [facility])   ACS (acute coronary syndrome) (HCC)   Non-ST elevation (NSTEMI) myocardial infarction (HCC)   Hyperlipidemia   Allergies Allergies  Allergen Reactions  . Azithromycin   . Bee Venom   . Ciprofloxacin   . Metronidazole     Diagnostic Studies/Procedures    Cath: 03/16/18  Conclusion     Prox RCA lesion is 30% stenosed.  Prox RCA to Mid RCA lesion is 100% stenosed.  Prox LAD lesion is 20% stenosed.  Mid LAD to Dist LAD lesion is 5% stenosed.  Post Atrio-1 lesion is 20% stenosed.  Post Atrio-2 lesion is 20% stenosed.  Post intervention, there is a 5% residual stenosis.  A stent was successfully placed.   Acute coronary syndrome secondary to total occlusion of the mid RCA with faint left to right collaterals.  There is evidence for mild coronary calcification.  The LAD has 20% proximal narrowing.  The mid LAD has mid systolic bridging with narrowing up to at least 50% during systole; a small ramus intermediate vessel is normal; the circumflex vessel is normal.  RCA is a dominant vessel that a 30% proximal stenosis and was totally occluded in its mid segment proximal to the takeoff of an acute marginal branch.  Once initial flow was established there was significant spasm in the mid distal RCA which ultimately improved with TIMI-3 flow following 100 mcg of IC nitroglycerin and successful revascularization. There is mild 20% PLA stenoses segmentally.  Successful PCI to the totally occluded mid RCA with ultimate insertion of a 2.5 x 15 mm Resolute Onyx stent postdilated to 2.78 mm with the 100% occlusion being reduced to less  than 5% and with initial TIMI 0 flow being improved to TIMI-3 flow.  RECOMMENDATION: DAPT for minimum of 1 year.  High potency statin therapy will be started.  With the patient's  low blood pressure his daily lisinopril 20 mg dose will be reduced initially to 2.5 mg.  Ultimately plan to add possibly nitrates or amlodipine with muscle bridging.  The patient is bradycardic which may limit beta-blocker treatment.  We will plan echo Doppler in a.m. to assess LV function.  Continue postprocedure hydration.   TTE: 03/17/18  Study Conclusions  - Left ventricle: The cavity size was normal. Wall thickness was   normal. Systolic function was normal. The estimated ejection   fraction was in the range of 50% to 55%. Wall motion was normal;   there were no regional wall motion abnormalities. - Left atrium: The atrium was moderately dilated. - Right atrium: The atrium was mildly dilated. _____________   History of Present Illness     62 year old male without significant past medical history other than hypertension who presented with chest pain.  He is an avid bike rider, riding multiple times a week.  Over the last few weeks he had noticed progressive decrease in exercise tolerance.  The morning of admission at 6 AM he woke up and while walking to get coffee noticed excruciating acute onset chest pain.  Throughout the entire day it increased and decreased in severity but never completely left.  He presented to the [facility], ED that afternoon/evening and had inferior T  wave inversions on his EKG.  First troponin was roughly 5.  Normal kidney function.  Upon interview he continued to have angina.  This had continued despite uptitrating nitroglycerin infusion to 35 mcg.  He was given heparin and full strength aspirin.  No significant family medical history of cardiovascular disease.  He is a non-smoker.  No associated review of systems which are positive including nausea vomiting, shortness of breath,  bleeding or bruising troubles or any other significant findings. Given his symptoms and persistent chest pain, he was was taken urgently for cardiac cath.   Hospital Course     He underwent cardiac cath noted above with Dr. Tresa Endo with total occlusion of the mRCA with faint left to right collaterals. Successful PCI/DES to mRCA x1. Plan for DAPT with ASA/Brilinta for at least 1 year. Troponin peaked at >65. LDL was 153. He was also started on high dose statin. Blood pressures were noted to be soft and his lisinopril was reduced from 20mg  daily to 2.5mg . Remained to bradycardiac for the addition of BB therapy. Follow up echo showed normal EF with no WMA noted. He worked well with cardiac rehab without complications.   General: Well developed, well nourished, male appearing in no acute distress. Head: Normocephalic, atraumatic.  Neck: Supple without bruits, JVD. Lungs:  Resp regular and unlabored, CTA. Heart: RRR, S1, S2, no S3, S4, or murmur; no rub. Abdomen: Soft, non-tender, non-distended with normoactive bowel sounds. No hepatomegaly. No rebound/guarding. No obvious abdominal masses. Extremities: No clubbing, cyanosis, edema. Distal pedal pulses are 2+ bilaterally. R radial cath site stable without bruising or hematoma Neuro: Alert and oriented X 3. Moves all extremities spontaneously. Psych: Normal affect.  Elijah Baker was seen by Dr. Katrinka Blazing and determined stable for discharge home. Follow up in the office has been arranged. Medications are listed below.   _____________  Discharge Vitals Blood pressure 111/72, pulse (!) 55, temperature 97.9 F (36.6 C), temperature source Oral, resp. rate 18, height 6\' 2"  (1.88 m), weight 187 lb 6.4 oz (85 kg), SpO2 95 %.  Filed Weights   03/16/18 1656 03/18/18 0600  Weight: 185 lb (83.9 kg) 187 lb 6.4 oz (85 kg)    Labs & Radiologic Studies    CBC Recent Labs    03/17/18 0702 03/18/18 0336  WBC 11.1* 8.6  HGB 13.9 13.2  HCT 40.9 39.2    MCV 93.4 95.4  PLT 228 213   Basic Metabolic Panel Recent Labs    16/10/96 1713 03/17/18 0702  NA 136 139  K 4.4 4.2  CL 102 105  CO2 25 25  GLUCOSE 122* 112*  BUN 14 10  CREATININE 1.06 1.07  CALCIUM 9.7 8.8*   Liver Function Tests Recent Labs    03/17/18 0702  AST 194*  ALT 50*  ALKPHOS 35*  BILITOT 1.0  PROT 5.4*  ALBUMIN 3.5   No results for input(s): LIPASE, AMYLASE in the last 72 hours. Cardiac Enzymes Recent Labs    03/16/18 2222  TROPONINI >65.00*   BNP Invalid input(s): POCBNP D-Dimer No results for input(s): DDIMER in the last 72 hours. Hemoglobin A1C No results for input(s): HGBA1C in the last 72 hours. Fasting Lipid Panel Recent Labs    03/16/18 1753  CHOL 241*  HDL 75  LDLCALC 153*  TRIG 67  CHOLHDL 3.2   Thyroid Function Tests No results for input(s): TSH, T4TOTAL, T3FREE, THYROIDAB in the last 72 hours.  Invalid input(s): FREET3 _____________  Dg Chest 2 View  Result Date: 03/16/2018 CLINICAL DATA:  Intermittent chest pain for the past few days with shortness of breath. EXAM: CHEST - 2 VIEW COMPARISON:  None. FINDINGS: The heart size and mediastinal contours are within normal limits. Both lungs are clear. The visualized skeletal structures are unremarkable. IMPRESSION: No active cardiopulmonary disease. Electronically Signed   By: Obie DredgeWilliam T Derry M.D.   On: 03/16/2018 17:48   Disposition   Pt is being discharged home today in good condition.  Follow-up Plans & Appointments    Follow-up Information    Azalee CourseMeng, Hao, [place] Follow up on 03/27/2018.   Specialties:  Cardiology, Radiology Why:  at 10am for your follow up appt.  Contact information: [address] [place] [id number] [phone]          Discharge Instructions    AMB Referral to Cardiac Rehabilitation - Phase II   Complete by:  As directed    Diagnosis:  NSTEMI   Amb Referral to Cardiac Rehabilitation   Complete by:  As directed    Diagnosis:    NSTEMI Coronary Stents     Call MD for:  redness, tenderness, or signs of infection (pain, swelling, redness, odor or green/yellow discharge around incision site)   Complete by:  As directed    Diet - low sodium heart healthy   Complete by:  As directed    Discharge instructions   Complete by:  As directed    Radial Site Care Refer to this sheet in the next few weeks. These instructions provide you with information on caring for yourself after your procedure. Your caregiver may also give you more specific instructions. Your treatment has been planned according to current medical practices, but problems sometimes occur. Call your caregiver if you have any problems or questions after your procedure. HOME CARE INSTRUCTIONS You may shower the day after the procedure.Remove the bandage (dressing) and gently wash the site with plain soap and water.Gently pat the site dry.  Do not apply powder or lotion to the site.  Do not submerge the affected site in water for 3 to 5 days.  Inspect the site at least twice daily.  Do not flex or bend the affected arm for 24 hours.  No lifting over 5 pounds (2.3 kg) for 5 days after your procedure.  Do not drive home if you are discharged the same day of the procedure. Have someone else drive you.  You may drive 24 hours after the procedure unless otherwise instructed by your caregiver.  What to expect: Any bruising will usually fade within 1 to 2 weeks.  Blood that collects in the tissue (hematoma) may be painful to the touch. It should usually decrease in size and tenderness within 1 to 2 weeks.  SEEK IMMEDIATE MEDICAL CARE IF: You have unusual pain at the radial site.  You have redness, warmth, swelling, or pain at the radial site.  You have drainage (other than a small amount of blood on the dressing).  You have chills.  You have a fever or persistent symptoms for more than 72 hours.  You have a fever and your symptoms suddenly get worse.  Your arm  becomes pale, cool, tingly, or numb.  You have heavy bleeding from the site. Hold pressure on the site.   PLEASE DO NOT MISS ANY DOSES OF YOUR BRILINTA!!!!! Also keep a log of you blood pressures and bring back to your follow up appt. Please call the office with any questions.   Patients taking  blood thinners should generally stay away from medicines like ibuprofen, Advil, Motrin, naproxen, and Aleve due to risk of stomach bleeding. You may take Tylenol as directed or talk to your primary doctor about alternatives.   Increase activity slowly   Complete by:  As directed        Discharge Medications     Medication List    STOP taking these medications   OVER THE COUNTER MEDICATION     TAKE these medications   aspirin 81 MG chewable tablet Chew 1 tablet (81 mg total) by mouth daily.   atorvastatin 80 MG tablet Commonly known as:  LIPITOR Take 1 tablet (80 mg total) by mouth daily at 6 PM.   DIGESTIVE HEALTH PROBIOTIC PO Take 1 tablet by mouth daily.   lisinopril 2.5 MG tablet Commonly known as:  PRINIVIL,ZESTRIL Take 1 tablet (2.5 mg total) by mouth daily. What changed:    medication strength  how much to take   nitroGLYCERIN 0.4 MG SL tablet Commonly known as:  NITROSTAT Place 1 tablet (0.4 mg total) under the tongue every 5 (five) minutes x 3 doses as needed for chest pain.   ticagrelor 90 MG Tabs tablet Commonly known as:  BRILINTA Take 1 tablet (90 mg total) by mouth 2 (two) times daily.        Acute coronary syndrome (MI, NSTEMI, STEMI, etc) this admission?: Yes.     AHA/ACC Clinical Performance & Quality Measures: 1. Aspirin prescribed? - Yes 2. ADP Receptor Inhibitor (Plavix/Clopidogrel, Brilinta/Ticagrelor or Effient/Prasugrel) prescribed (includes medically managed patients)? - Yes 3. Beta Blocker prescribed? - No - bradycardia 4. High Intensity Statin (Lipitor 40-80mg  or Crestor 20-40mg ) prescribed? - Yes 5. EF assessed during THIS hospitalization? -  Yes 6. For EF <40%, was ACEI/ARB prescribed? - Yes 7. For EF <40%, Aldosterone Antagonist (Spironolactone or Eplerenone) prescribed? - Not Applicable (EF >/= 40%) 8. Cardiac Rehab Phase II ordered (Included Medically managed Patients)? - Yes   Outstanding Labs/Studies   FLP/LFTs in 6 weeks if tolerating statin.   Duration of Discharge Encounter   Greater than 30 minutes including physician time.  Signed, Laverda Page NP-C 03/18/2018, 9:16 AM

## 2018-03-18 NOTE — Discharge Instructions (Signed)

## 2018-03-18 NOTE — Plan of Care (Signed)
  Problem: Pain Managment: Goal: General experience of comfort will improve Outcome: Progressing   

## 2018-03-19 ENCOUNTER — Telehealth (HOSPITAL_COMMUNITY): Payer: Self-pay

## 2018-03-19 NOTE — Telephone Encounter (Signed)
7/3/19NO PHONE NUMBER LISTED TO CONTACT PATIENT

## 2018-03-19 NOTE — Telephone Encounter (Signed)
Called patient to see if he was interested in participating in the Cardiac Rehab Program. No answer, lm on vm.

## 2018-03-19 NOTE — Telephone Encounter (Signed)
Pt insurance is active and benefits verified through [place]. Co-pay $0.00, DED $3,500.00/$0.00 met, out of pocket $6,350.00/$137.19 met, co-insurance 20%. No pre-authorization required. [employer], 03/19/18 @ 10:48AM, POI#[id number]  Will contact patient to see if he is interested in the Cardiac Rehab Program. If interested, patient will need to complete follow up appt. Once completed, patient will be contacted for scheduling upon review by the RN Navigator.

## 2018-03-21 NOTE — Telephone Encounter (Signed)
Patient contacted regarding discharge from 03/16/2018 - 03/18/2018 (2 days) [facility]  Patient understands to follow up with provider 03/27/18 @ 10am [facility]ao[facility]. Patient understands discharge instructions? YES Patient understands medications and regiment? YES Patient understands to bring all medications to this visit? YES  PT STATES THAT HE UNDERSTANDS EVERYTHING FINE AND HAS NO FURTHER QUESTIONS.

## 2018-03-27 ENCOUNTER — Ambulatory Visit: Payer: Managed Care, Other (non HMO) | Admitting: Physician Assistant

## 2018-03-27 ENCOUNTER — Encounter: Payer: Self-pay | Admitting: Physician Assistant

## 2018-03-27 VITALS — BP 141/79 | HR 59 | Ht 74.0 in | Wt 185.0 lb

## 2018-03-27 DIAGNOSIS — I10 Essential (primary) hypertension: Secondary | ICD-10-CM

## 2018-03-27 DIAGNOSIS — I2510 Atherosclerotic heart disease of native coronary artery without angina pectoris: Secondary | ICD-10-CM

## 2018-03-27 DIAGNOSIS — E7849 Other hyperlipidemia: Secondary | ICD-10-CM

## 2018-03-27 DIAGNOSIS — Q245 Malformation of coronary vessels: Secondary | ICD-10-CM

## 2018-03-27 MED ORDER — AMLODIPINE BESYLATE 2.5 MG PO TABS: 2.5000 mg | ORAL_TABLET | Freq: Every day | ORAL | 3 refills | Status: DC

## 2018-03-27 NOTE — Patient Instructions (Signed)
Medication Instructions:  START Norvasc 2.5mg  take 1 tablet once a day   Labwork: Your physician recommends that you return for lab work in: 6 weeks (05/08/2018) LIPIDS, LFT  Testing/Procedures: NONE   Follow-Up: Your physician recommends that you schedule a follow-up appointment in: 3 MONTHS WITH DR Tresa EndoKELLY  Any Other Special Instructions Will Be Listed Below (If Applicable). If you need a refill on your cardiac medications before your next appointment, please call your pharmacy.

## 2018-03-27 NOTE — Progress Notes (Signed)
Cardiology Office Note    Date:  03/29/2018   ID:  Elijah Baker, Elijah Baker 1956-08-09, MRN [id number]  PCP:  Medicine, [facility]  Cardiologist:   Dr. Tresa Endo  Chief Complaint  Patient presents with  . Follow-up    seen for Dr. Tresa Endo. Post Cath.    History of Present Illness:  Elijah Baker is a 62 y.o. male with PMH of HTN, HLD and newly diagnosed CAD in 2019.  He is a avid bike rider and the right multiple times a week.  For the past few weeks, he has been noticing progressive decrease in exercise tolerance.  In the morning of 03/16/2018, he woke up having excruciating chest discomfort prompting him to seek medical attention.  EKG showed inferolateral T wave inversion.  Initial troponin was positive at 4.26.  He was taken urgently to the Cath Lab.  Cardiac catheterization showed 30% proximal RCA disease, 100% proximal to mid RCA occlusion treated with 2.5 x 15 mm resolute Onyx DES postdilated to 2.78 mm, 20% proximal LAD, 20% posterior atrial lesion.  The mid LAD portion also have systolic bridging with narrowing up to at least 50% during systole.  After procedure, he was placed on aspirin and Brilinta.  His home lisinopril dose was reduced.  Echocardiogram obtained on 03/17/2018 showed EF 50 to 55%, moderately dilated left atrium.  Post-cath, his troponin trended up to greater than 65.  Lipid panel obtained on 03/16/2018 showed LDL 153, total cholesterol 241, HDL 75, triglyceride 67.  Liver enzyme was mildly elevated with AST of 194 and ALT 50.  He was not placed on beta-blocker due to baseline bradycardia.  Patient presents today for cardiology office visit.  He denies any recurrence of chest pain or shortness of breath.  He has been compliant with aspirin and Brilinta.  He has no lower extremity edema, orthopnea or PND.  EKG showed T wave inversion in inferolateral leads which is consistent with a recent cardiac event.  He is increasing his exercise activity.  I will add  low-dose 2.5 mg daily of amlodipine today to his medical regiment for his myocardial bridging.  He will need to return in 6 weeks for fasting lipid panel and LFT.  Otherwise he can see Dr. Tresa Endo in 38-month.   Past Medical History:  Diagnosis Date  . Hyperlipidemia   . Hypertension   . NSTEMI (non-ST elevated myocardial infarction) (HCC)    03/16/18 PCI/DES to mRCA, normal EF    Past Surgical History:  Procedure Laterality Date  . CORONARY/GRAFT ACUTE MI REVASCULARIZATION N/A 03/16/2018   Procedure: Coronary/Graft Acute MI Revascularization;  Surgeon: Lennette Bihari, MD;  Location: [facility] INVASIVE CV LAB;  Service: Cardiovascular;  Laterality: N/A;  . LEFT HEART CATH AND CORONARY ANGIOGRAPHY N/A 03/16/2018   Procedure: LEFT HEART CATH AND CORONARY ANGIOGRAPHY;  Surgeon: Lennette Bihari, MD;  Location: [facility] INVASIVE CV LAB;  Service: Cardiovascular;  Laterality: N/A;    Current Medications: Outpatient Medications Prior to Visit  Medication Sig Dispense Refill  . aspirin 81 MG chewable tablet Chew 1 tablet (81 mg total) by mouth daily.    Marland Kitchen atorvastatin (LIPITOR) 80 MG tablet Take 1 tablet (80 mg total) by mouth daily at 6 PM. 30 tablet 2  . Lactobacillus (DIGESTIVE HEALTH PROBIOTIC PO) Take 1 tablet by mouth daily.    Marland Kitchen lisinopril (PRINIVIL,ZESTRIL) 2.5 MG tablet Take 1 tablet (2.5 mg total) by mouth daily. 30 tablet 2  . nitroGLYCERIN (NITROSTAT) 0.4 MG  SL tablet Place 1 tablet (0.4 mg total) under the tongue every 5 (five) minutes x 3 doses as needed for chest pain. 25 tablet 0  . ticagrelor (BRILINTA) 90 MG TABS tablet Take 1 tablet (90 mg total) by mouth 2 (two) times daily. 60 tablet 11   No facility-administered medications prior to visit.      Allergies:   Azithromycin; Bee venom; Ciprofloxacin; and Metronidazole   Social History   Socioeconomic History  . Marital status: Married    Spouse name: Not on file  . Number of children: Not on file  . Years of education: Not on file  .  Highest education level: Not on file  Occupational History  . Not on file  Social Needs  . Financial resource strain: Not on file  . Food insecurity:    Worry: Not on file    Inability: Not on file  . Transportation needs:    Medical: Not on file    Non-medical: Not on file  Tobacco Use  . Smoking status: Never Smoker  . Smokeless tobacco: Never Used  Substance and Sexual Activity  . Alcohol use: Yes    Comment: 2 beers a day  . Drug use: Never  . Sexual activity: Yes  Lifestyle  . Physical activity:    Days per week: Not on file    Minutes per session: Not on file  . Stress: Not on file  Relationships  . Social connections:    Talks on phone: Not on file    Gets together: Not on file    Attends religious service: Not on file    Active member of club or organization: Not on file    Attends meetings of clubs or organizations: Not on file    Relationship status: Not on file  Other Topics Concern  . Not on file  Social History Narrative  . Not on file     Family History:  The patient's family history includes Parkinson's disease in his father.   ROS:   Please see the history of present illness.    ROS All other systems reviewed and are negative.   PHYSICAL EXAM:   VS:  BP (!) 141/79   Pulse (!) 59   Ht 6\' 2"  (1.88 m)   Wt 185 lb (83.9 kg)   BMI 23.75 kg/m    GEN: Well nourished, well developed, in no acute distress  HEENT: normal  Neck: no JVD, carotid bruits, or masses Cardiac: RRR; no murmurs, rubs, or gallops,no edema  Respiratory:  clear to auscultation bilaterally, normal work of breathing GI: soft, nontender, nondistended, + BS MS: no deformity or atrophy  Skin: warm and dry, no rash Neuro:  Alert and Oriented x 3, Strength and sensation are intact Psych: euthymic mood, full affect  Wt Readings from Last 3 Encounters:  03/27/18 185 lb (83.9 kg)  03/18/18 187 lb 6.4 oz (85 kg)      Studies/Labs Reviewed:   EKG:  EKG is ordered today.  The ekg  ordered today demonstrates normal sinus rhythm, T wave inversion in inferolateral leads  Recent Labs: 03/17/2018: ALT 50; BUN 10; Creatinine, Ser 1.07; Potassium 4.2; Sodium 139 03/18/2018: Hemoglobin 13.2; Platelets 213   Lipid Panel    Component Value Date/Time   CHOL 241 (H) 03/16/2018 1753   TRIG 67 03/16/2018 1753   HDL 75 03/16/2018 1753   CHOLHDL 3.2 03/16/2018 1753   VLDL 13 03/16/2018 1753   LDLCALC 153 (H) 03/16/2018 1753  Additional studies/ records that were reviewed today include:   Cath: 03/16/18  Conclusion     Prox RCA lesion is 30% stenosed.  Prox RCA to Mid RCA lesion is 100% stenosed.  Prox LAD lesion is 20% stenosed.  Mid LAD to Dist LAD lesion is 5% stenosed.  Post Atrio-1 lesion is 20% stenosed.  Post Atrio-2 lesion is 20% stenosed.  Post intervention, there is a 5% residual stenosis.  A stent was successfully placed.  Acute coronary syndrome secondary to total occlusion of the mid RCA with faint left to right collaterals.  There is evidence for mild coronary calcification. The LAD has 20% proximal narrowing. The mid LAD has mid systolic bridging with narrowing up to at least 50% during systole; a small ramus intermediate vessel is normal; the circumflex vessel is normal.  RCA is a dominant vessel that a 30% proximal stenosis and was totally occluded in its mid segment proximal to the takeoff of an acute marginal branch. Once initial flow was established there was significant spasm in the mid distal RCA which ultimately improved with TIMI-3 flow following 100 mcg of IC nitroglycerin and successful revascularization. There is mild 20% PLA stenoses segmentally.  Successful PCI to the totally occluded mid RCA with ultimate insertion of a 2.5 x 15 mm Resolute Onyx stent postdilated to 2.78 mm with the 100% occlusion being reduced to less than 5% and with initial TIMI 0 flow being improved to TIMI-3 flow.  RECOMMENDATION: DAPT for minimum of  1 year. High potency statin therapy will be started. With the patient's low blood pressure his daily lisinopril 20 mg dose will be reduced initially to 2.5 mg. Ultimately plan to add possibly nitrates or amlodipine with muscle bridging. The patient is bradycardic which may limit beta-blocker treatment. We will plan echo Doppler in a.m. to assess LV function. Continue postprocedure hydration.    TTE: 03/17/18  Study Conclusions  - Left ventricle: The cavity size was normal. Wall thickness was normal. Systolic function was normal. The estimated ejection fraction was in the range of 50% to 55%. Wall motion was normal; there were no regional wall motion abnormalities. - Left atrium: The atrium was moderately dilated. - Right atrium: The atrium was mildly dilated. _____________      ASSESSMENT:    1. Coronary artery disease involving native coronary artery of native heart without angina pectoris   2. Other hyperlipidemia   3. Essential hypertension   4. Myocardial bridge      PLAN:  In order of problems listed above:  1. CAD: Status post recent DES to RCA.  Continue aspirin and Brilinta.  2. Hyperlipidemia: Continue current lipitor, fasting lipid panel and LFT in 6 weeks.  3. Myocardial bridging: Seen in the mid LAD portion on cath, with reduction of 50% diameter during systole.  Add amlodipine 2.5 mg daily  4. Hypertension: Blood pressure mildly elevated today, however normally his blood pressure is quite well controlled at home.  We will add low-dose amlodipine for myocardial bridging and its vasodilatory effect.    Medication Adjustments/Labs and Tests Ordered: Current medicines are reviewed at length with the patient today.  Concerns regarding medicines are outlined above.  Medication changes, Labs and Tests ordered today are listed in the Patient Instructions below. Patient Instructions  Medication Instructions:  START Norvasc 2.5mg  take 1 tablet once a day     Labwork: Your physician recommends that you return for lab work in: 6 weeks (05/08/2018) LIPIDS, LFT  Testing/Procedures: NONE  Follow-Up: Your physician recommends that you schedule a follow-up appointment in: 3 MONTHS WITH DR Tresa EndoKELLY  Any Other Special Instructions Will Be Listed Below (If Applicable). If you need a refill on your cardiac medications before your next appointment, please call your pharmacy.     Ramond DialSigned, Olivier Frayre, [place]  03/29/2018 11:09 PM    [facility] [address], [place], [place]  [address] Phone: [phone]; Fax: [phone]

## 2018-03-29 ENCOUNTER — Encounter: Payer: Self-pay | Admitting: Physician Assistant

## 2018-04-02 ENCOUNTER — Telehealth (HOSPITAL_COMMUNITY): Payer: Self-pay

## 2018-04-02 NOTE — Telephone Encounter (Signed)
Attempted to contact patient in regards to Cardiac Rehab - lm on vm °

## 2018-04-09 ENCOUNTER — Telehealth (HOSPITAL_COMMUNITY): Payer: Self-pay

## 2018-04-09 NOTE — Telephone Encounter (Signed)
Called patient in regards to Cardiac Rehab - Patient stated he is not interested in the program. Patient stated he is doing well and riding his bike. Closed referral.

## 2018-05-30 LAB — HEPATIC FUNCTION PANEL
ALK PHOS: 57 IU/L (ref 39–117)
ALT: 57 IU/L — ABNORMAL HIGH (ref 0–44)
AST: 31 IU/L (ref 0–40)
Albumin: 4.7 g/dL (ref 3.5–5.5)
BILIRUBIN TOTAL: 0.7 mg/dL (ref 0.0–1.2)
BILIRUBIN, DIRECT: 0.22 mg/dL (ref 0.00–0.40)
TOTAL PROTEIN: 6.4 g/dL (ref 6.0–8.5)

## 2018-05-30 LAB — LIPID PANEL
CHOL/HDL RATIO: 2.3 ratio (ref 0.0–5.0)
Cholesterol, Total: 137 mg/dL (ref 100–199)
HDL: 60 mg/dL (ref >39)
LDL CALC: 64 mg/dL (ref 0–99)
Triglycerides: 66 mg/dL (ref 0–149)
VLDL Cholesterol Cal: 13 mg/dL (ref 5–40)

## 2018-06-17 ENCOUNTER — Other Ambulatory Visit: Payer: Self-pay | Admitting: *Deleted

## 2018-07-01 ENCOUNTER — Encounter: Payer: Self-pay | Admitting: Cardiovascular Disease

## 2018-07-01 ENCOUNTER — Ambulatory Visit: Payer: Managed Care, Other (non HMO) | Admitting: Cardiovascular Disease

## 2018-07-01 VITALS — BP 130/70 | HR 55 | Ht 74.0 in | Wt 184.8 lb

## 2018-07-01 DIAGNOSIS — I2510 Atherosclerotic heart disease of native coronary artery without angina pectoris: Secondary | ICD-10-CM | POA: Diagnosis not present

## 2018-07-01 DIAGNOSIS — I10 Essential (primary) hypertension: Secondary | ICD-10-CM

## 2018-07-01 DIAGNOSIS — Z79899 Other long term (current) drug therapy: Secondary | ICD-10-CM | POA: Diagnosis not present

## 2018-07-01 DIAGNOSIS — E785 Hyperlipidemia, unspecified: Secondary | ICD-10-CM

## 2018-07-01 NOTE — Progress Notes (Signed)
Cardiology Office Note    Date:  07/01/2018   ID:  Elijah Baker, Elijah Baker Sep 14, 1956, MRN [id number]  PCP:  Medicine, [facility]  Cardiologist:  Shelva Majestic, MD   No chief complaint on file.  Initial office visit with me following his STEMI  History of Present Illness:  Elijah Baker is a 62 y.o. male presents to the office in follow-up of his recent inferior ST segment elevation MI.  Elijah Baker has a history of hypertension and remotely had been on cholesterol medication which was discontinued approximately 5 years ago.  He is an avid cyclist and exercises regularly.  On the morning of March 16, 2018 he developed new onset chest pressure.  His chest pressure wax and wane throughout the entire day and he ultimately presented to the hospital for persistent comfort in the evening.  He was found to have initial inferolateral T wave inversion and ongoing chest pain requiring increasing doses of nitroglycerin.  Initial troponin was positive at 4.26.  As result I took it urgently to the cardiac catheterization laboratory where he was found to have an acute coronary system [place] secondary to total occlusion of the mid RCA with very faint left to right collaterals.  There was mild coronary calcification.  The LAD had 20% proximal narrowing.  There was midsystolic bridging of the mid LAD with narrowing up to at least 50% during systole.  He had a normal ramus intermediate and circumflex vessel.  His RCA had 30% proximal stenosis and was totally occluded in its mid segment proximal to the takeoff of an acute marginal branch.  Once initial flow was established there was significant spasm in the mid distal RCA which ultimately improved to TIMI-3 flow following 100 mcg of intracoronary nitroglycerin and successful revascularization.  He was discharged 2 days later.  EF on echo was approximately 50% to 55% the day after his MI and he had normal wall motion.  He saw Almyra Deforest, PA-C for  initial post hospital evaluation and amlodipine 2.5 mg mg was added to his regimen.  Over the past 3 months he has continued to do well.  He is now biking typically up to 2 hours at a time.  He denies any chest pain.  He denies any shortness of breath.  He denies palpitations.  He has been taking atorvastatin 80 mg.  His initial LDL during his hospitalization was 154 which improved to 64 with therapy.  He presents for evaluation.   Past Medical History:  Diagnosis Date  . Hyperlipidemia   . Hypertension   . NSTEMI (non-ST elevated myocardial infarction) ([place])    03/16/18 PCI/DES to mRCA, normal EF    Past Surgical History:  Procedure Laterality Date  . CORONARY/GRAFT ACUTE MI REVASCULARIZATION N/A 03/16/2018   Procedure: Coronary/Graft Acute MI Revascularization;  Surgeon: Troy Sine, MD;  Location: [place] CV LAB;  Service: Cardiovascular;  Laterality: N/A;  . LEFT HEART CATH AND CORONARY ANGIOGRAPHY N/A 03/16/2018   Procedure: LEFT HEART CATH AND CORONARY ANGIOGRAPHY;  Surgeon: Troy Sine, MD;  Location: [place] CV LAB;  Service: Cardiovascular;  Laterality: N/A;    Current Medications: Outpatient Medications Prior to Visit  Medication Sig Dispense Refill  . aspirin 81 MG chewable tablet Chew 1 tablet (81 mg total) by mouth daily.    Marland Kitchen atorvastatin (LIPITOR) 80 MG tablet Take 1 tablet (80 mg total) by mouth daily at 6 PM. 30 tablet 2  . Lactobacillus (DIGESTIVE HEALTH  PROBIOTIC PO) Take 1 tablet by mouth daily.    Marland Kitchen lisinopril (PRINIVIL,ZESTRIL) 2.5 MG tablet Take 1 tablet (2.5 mg total) by mouth daily. 30 tablet 2  . nitroGLYCERIN (NITROSTAT) 0.4 MG SL tablet Place 1 tablet (0.4 mg total) under the tongue every 5 (five) minutes x 3 doses as needed for chest pain. 25 tablet 0  . ticagrelor (BRILINTA) 90 MG TABS tablet Take 1 tablet (90 mg total) by mouth 2 (two) times daily. 60 tablet 11  . amLODipine (NORVASC) 2.5 MG tablet Take 1 tablet (2.5 mg total) by mouth daily.  180 tablet 3   No facility-administered medications prior to visit.      Allergies:   Azithromycin; Bee venom; Ciprofloxacin; and Metronidazole   Social History   Socioeconomic History  . Marital status: Married    Spouse name: Not on file  . Number of children: Not on file  . Years of education: Not on file  . Highest education level: Not on file  Occupational History  . Not on file  Social Needs  . Financial resource strain: Not on file  . Food insecurity:    Worry: Not on file    Inability: Not on file  . Transportation needs:    Medical: Not on file    Non-medical: Not on file  Tobacco Use  . Smoking status: Never Smoker  . Smokeless tobacco: Never Used  Substance and Sexual Activity  . Alcohol use: Yes    Comment: 2 beers a day  . Drug use: Never  . Sexual activity: Yes  Lifestyle  . Physical activity:    Days per week: Not on file    Minutes per session: Not on file  . Stress: Not on file  Relationships  . Social connections:    Talks on phone: Not on file    Gets together: Not on file    Attends religious service: Not on file    Active member of club or organization: Not on file    Attends meetings of clubs or organizations: Not on file    Relationship status: Not on file  Other Topics Concern  . Not on file  Social History Narrative  . Not on file     Family History:  The patient's family history includes Parkinson's disease in his father.   ROS General: Negative; No fevers, chills, or night sweats;  HEENT: Negative; No changes in vision or hearing, sinus congestion, difficulty swallowing Pulmonary: Negative; No cough, wheezing, shortness of breath, hemoptysis Cardiovascular: Negative; No chest pain, presyncope, syncope, palpitations GI: Negative; No nausea, vomiting, diarrhea, or abdominal pain GU: Negative; No dysuria, hematuria, or difficulty voiding Musculoskeletal: Negative; no myalgias, joint pain, or weakness Hematologic/Oncology: Negative;  no easy bruising, bleeding Endocrine: Negative; no heat/cold intolerance; no diabetes Neuro: Negative; no changes in balance, headaches Skin: Negative; No rashes or skin lesions Psychiatric: Negative; No behavioral problems, depression Sleep: Negative; No snoring, daytime sleepiness, hypersomnolence, bruxism, restless legs, hypnogognic hallucinations, no cataplexy Other comprehensive 14 point system review is negative.   PHYSICAL EXAM:   VS:  BP 130/70 (BP Location: Right Arm, Patient Position: Sitting, Cuff Size: Normal)   Pulse (!) 55   Ht '6\' 2"'$  (1.88 m)   Wt 184 lb 12.8 oz (83.8 kg)   BMI 23.73 kg/m     Repeat blood pressure by me was 124/76  Wt Readings from Last 3 Encounters:  07/01/18 184 lb 12.8 oz (83.8 kg)  03/27/18 185 lb (83.9 kg)  03/18/18  187 lb 6.4 oz (85 kg)    General: Alert, oriented, no distress.  Skin: normal turgor, no rashes, warm and dry HEENT: Normocephalic, atraumatic. Pupils equal round and reactive to light; sclera anicteric; extraocular muscles intact; Fundi normal Nose without nasal septal hypertrophy Mouth/Parynx benign; Mallinpatti scale 2 Neck: No JVD, no carotid bruits; normal carotid upstroke Lungs: clear to ausculatation and percussion; no wheezing or rales Chest wall: without tenderness to palpitation Heart: PMI not displaced, RRR, s1 s2 normal, 1/6 systolic murmur, no diastolic murmur, no rubs, gallops, thrills, or heaves Abdomen: soft, nontender; no hepatosplenomehaly, BS+; abdominal aorta nontender and not dilated by palpation. Back: no CVA tenderness Pulses 2+ Musculoskeletal: full range of motion, normal strength, no joint deformities Extremities: no clubbing cyanosis or edema, Homan's sign negative  Neurologic: grossly nonfocal; Cranial nerves grossly wnl Psychologic: Normal mood and affect   Studies/Labs Reviewed:   EKG:  EKG is ordered today. ECG (independently read by me): Sinus bradycardia at 55 bpm.  Inferolateral T wave  abnormality.  Preserved R waves.  Normal intervals.  No ectopy.  Recent Labs: BMP Latest Ref Rng & Units 03/17/2018 03/16/2018  Glucose 70 - 99 mg/dL 112(H) 122(H)  BUN 6 - 20 mg/dL 10 14  Creatinine 0.61 - 1.24 mg/dL 1.07 1.06  Sodium 135 - 145 mmol/L 139 136  Potassium 3.5 - 5.1 mmol/L 4.2 4.4  Chloride 98 - 111 mmol/L 105 102  CO2 22 - 32 mmol/L 25 25  Calcium 8.9 - 10.3 mg/dL 8.8(L) 9.7     Hepatic Function Latest Ref Rng & Units 05/30/2018 03/17/2018  Total Protein 6.0 - 8.5 g/dL 6.4 5.4(L)  Albumin 3.5 - 5.5 g/dL 4.7 3.5  AST 0 - 40 IU/L 31 194(H)  ALT 0 - 44 IU/L 57(H) 50(H)  Alk Phosphatase 39 - 117 IU/L 57 35(L)  Total Bilirubin 0.0 - 1.2 mg/dL 0.7 1.0  Bilirubin, Direct 0.00 - 0.40 mg/dL 0.22 0.2    CBC Latest Ref Rng & Units 03/18/2018 03/17/2018 03/16/2018  WBC 4.0 - 10.5 K/uL 8.6 11.1(H) 11.8(H)  Hemoglobin 13.0 - 17.0 g/dL 13.2 13.9 15.9  Hematocrit 39.0 - 52.0 % 39.2 40.9 47.1  Platelets 150 - 400 K/uL 213 228 288   Lab Results  Component Value Date   MCV 95.4 03/18/2018   MCV 93.4 03/17/2018   MCV 92.9 03/16/2018   No results found for: TSH No results found for: HGBA1C   BNP No results found for: BNP  ProBNP No results found for: PROBNP   Lipid Panel     Component Value Date/Time   CHOL 137 05/30/2018 0804   TRIG 66 05/30/2018 0804   HDL 60 05/30/2018 0804   CHOLHDL 2.3 05/30/2018 0804   CHOLHDL 3.2 03/16/2018 1753   VLDL 13 03/16/2018 1753   LDLCALC 64 05/30/2018 0804     RADIOLOGY: No results found.   Additional studies/ records that were reviewed today include:  I reviewed his hospitalization, catheterization report, laboratory and subsequent office evaluation.   ASSESSMENT:    1. Inferior STEMI secondary to RCA occlusion, treated with DES stent March 16, 2018   2. Coronary artery disease involving native coronary artery of native heart without angina pectoris   3. CAD in native artery   4. Essential hypertension   5. Medication  management   6. Hyperlipidemia with target LDL less than 70     PLAN:  Elijah Baker is a very healthy-appearing 62 year old gentleman who suffered an acute coronary syndrome on  March 16, 2018 secondary to total occlusion of the mid RCA with faint left-to-right collaterals.  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  Underwent successful intervention with ultimate insertion of a 2.5 x 15 mm Resolute Onyx DES stent postdilated to 2.78 mm with the 100% occlusion being reduced to less than 5% and with resolution of TIMI-3 flow.  His echo Doppler study done the following day showed normalization of LV function without wall motion abnormalities.  Subsequently, he is asymptomatic.  He is exercising regularly and may often ride his bike for up to 2 to 3 hours at a time.  His blood pressure today is well controlled on his regimen consisting of amlodipine 2.5 mg and lisinopril 2.5 mg daily.  He continues to be on aspirin and Brilinta for antiplatelet therapy.  He is on atorvastatin 80 mg for hyperlipidemia with significant improvement in his LDL from a baseline of 153 to most recently at 64.  He is doing exceptionally well.  In 3 months, he will undergo follow-up laboratory.  I will see him in 3 to 4 months for reevaluation.  We discussed continuing dual antiplatelet therapy for minimum of 1 year.   Medication Adjustments/Labs and Tests Ordered: Current medicines are reviewed at length with the patient today.  Concerns regarding medicines are outlined above.  Medication changes, Labs and Tests ordered today are listed in the Patient Instructions below. Patient Instructions  Medication Instructions:  Your physician recommends that you continue on your current medications as directed. Please refer to the Current Medication list given to you today.  If you need a refill on your cardiac medications before your next appointment, please call your pharmacy.    Lab work: Please return for FASTING labs in 3-4 months (CMET, CBC, Lipid, TSH)  Our in office lab hours are Monday-Friday 8:00-4:00, closed for lunch 12:45-1:45 pm.  No appointment needed.  If you have labs (blood work) drawn today and your tests are completely normal, you will receive your results only by: Marland Kitchen MyChart Message (if you have MyChart) OR . A paper copy in the mail If you have any lab test that is abnormal or we need to change your treatment, we will call you to review the results.  Follow-Up: At [facility], you and your health needs are our priority.  As part of our continuing mission to provide you with exceptional heart care, we have created designated Provider Care Teams.  These Care Teams include your primary Cardiologist (physician) and Advanced Practice Providers (APPs -  Physician Assistants and Nurse Practitioners) who all work together to provide you with the care you need, when you need it. You will need a follow up appointment in 4 months.  Please call our office 2 months in advance to schedule this appointment.  You may see Shelva Majestic, MD or one of the following Advanced Practice Providers on your designated Care Team: [place], [place] . Fabian Sharp, PA-C      Signed, Shelva Majestic, MD  07/01/2018 6:26 PM    [facility] [address] [place], [place], [place]  [address] Phone: [phone]

## 2018-07-01 NOTE — Patient Instructions (Signed)
Medication Instructions:  Your physician recommends that you continue on your current medications as directed. Please refer to the Current Medication list given to you today.  If you need a refill on your cardiac medications before your next appointment, please call your pharmacy.   Lab work: Please return for FASTING labs in 3-4 months (CMET, CBC, Lipid, TSH)  Our in office lab hours are Monday-Friday 8:00-4:00, closed for lunch 12:45-1:45 pm.  No appointment needed.  If you have labs (blood work) drawn today and your tests are completely normal, you will receive your results only by: Marland Kitchen MyChart Message (if you have MyChart) OR . A paper copy in the mail If you have any lab test that is abnormal or we need to change your treatment, we will call you to review the results.  Follow-Up: At [facility], you and your health needs are our priority.  As part of our continuing mission to provide you with exceptional heart care, we have created designated Provider Care Teams.  These Care Teams include your primary Cardiologist (physician) and Advanced Practice Providers (APPs -  Physician Assistants and Nurse Practitioners) who all work together to provide you with the care you need, when you need it. You will need a follow up appointment in 4 months.  Please call our office 2 months in advance to schedule this appointment.  You may see Nicki Guadalajara, MD or one of the following Advanced Practice Providers on your designated Care Team: [place], [place] . Micah Flesher, PA-C

## 2018-10-25 LAB — LIPID PANEL
CHOL/HDL RATIO: 2.5 ratio (ref 0.0–5.0)
Cholesterol, Total: 130 mg/dL (ref 100–199)
HDL: 53 mg/dL (ref >39)
LDL CALC: 58 mg/dL (ref 0–99)
Triglycerides: 96 mg/dL (ref 0–149)
VLDL Cholesterol Cal: 19 mg/dL (ref 5–40)

## 2018-10-25 LAB — COMPREHENSIVE METABOLIC PANEL
A/G RATIO: 2.6 — AB (ref 1.2–2.2)
ALT: 53 IU/L — ABNORMAL HIGH (ref 0–44)
AST: 27 IU/L (ref 0–40)
Albumin: 4.7 g/dL (ref 3.8–4.9)
Alkaline Phosphatase: 64 IU/L (ref 39–117)
BUN/Creatinine Ratio: 13 (ref 10–24)
BUN: 15 mg/dL (ref 8–27)
Bilirubin Total: 0.9 mg/dL (ref 0.0–1.2)
CALCIUM: 10.1 mg/dL (ref 8.6–10.2)
CO2: 23 mmol/L (ref 20–29)
Chloride: 99 mmol/L (ref 96–106)
Creatinine, Ser: 1.15 mg/dL (ref 0.76–1.27)
GFR, EST AFRICAN AMERICAN: 80 mL/min/{1.73_m2} (ref >59)
GFR, EST NON AFRICAN AMERICAN: 69 mL/min/{1.73_m2} (ref >59)
GLOBULIN, TOTAL: 1.8 g/dL (ref 1.5–4.5)
Glucose: 97 mg/dL (ref 65–99)
POTASSIUM: 5 mmol/L (ref 3.5–5.2)
Sodium: 139 mmol/L (ref 134–144)
Total Protein: 6.5 g/dL (ref 6.0–8.5)

## 2018-10-25 LAB — CBC
HEMATOCRIT: 45.8 % (ref 37.5–51.0)
Hemoglobin: 15.9 g/dL (ref 13.0–17.7)
MCH: 32.1 pg (ref 26.6–33.0)
MCHC: 34.7 g/dL (ref 31.5–35.7)
MCV: 93 fL (ref 79–97)
Platelets: 273 10*3/uL (ref 150–450)
RBC: 4.95 x10E6/uL (ref 4.14–5.80)
RDW: 12.7 % (ref 11.6–15.4)
WBC: 5.6 10*3/uL (ref 3.4–10.8)

## 2018-10-25 LAB — TSH: TSH: 1.46 u[IU]/mL (ref 0.450–4.500)

## 2018-11-12 ENCOUNTER — Encounter: Payer: Self-pay | Admitting: Cardiovascular Disease

## 2018-11-12 ENCOUNTER — Ambulatory Visit: Payer: BLUE CROSS/BLUE SHIELD | Admitting: Cardiovascular Disease

## 2018-11-12 VITALS — BP 132/80 | HR 61 | Ht 74.0 in | Wt 187.8 lb

## 2018-11-12 DIAGNOSIS — Q245 Malformation of coronary vessels: Secondary | ICD-10-CM

## 2018-11-12 DIAGNOSIS — E785 Hyperlipidemia, unspecified: Secondary | ICD-10-CM

## 2018-11-12 DIAGNOSIS — I2510 Atherosclerotic heart disease of native coronary artery without angina pectoris: Secondary | ICD-10-CM | POA: Diagnosis not present

## 2018-11-12 DIAGNOSIS — I10 Essential (primary) hypertension: Secondary | ICD-10-CM | POA: Diagnosis not present

## 2018-11-12 NOTE — Patient Instructions (Addendum)
Medication Instructions:  The current medical regimen is effective;  continue present plan and medications.  If you need a refill on your cardiac medications before your next appointment, please call your pharmacy.   LAB: Fasting labs (CMET, LIPID in 6 months before appointment)  Follow-Up: At [facility], you and your health needs are our priority.  As part of our continuing mission to provide you with exceptional heart care, we have created designated Provider Care Teams.  These Care Teams include your primary Cardiologist (physician) and Advanced Practice Providers (APPs -  Physician Assistants and Nurse Practitioners) who all work together to provide you with the care you need, when you need it. You will need a follow up appointment in 6 months.  Please call our office 2 months in advance to schedule this appointment.  You may see Nicki Guadalajara, MD or one of the following Advanced Practice Providers on your designated Care Team: [place], [place] . Micah Flesher, PA-C

## 2018-11-12 NOTE — Progress Notes (Signed)
Cardiology Office Note    Date:  11/12/2018   ID:  Isrrael, Fluckiger 04/20/1956, MRN [id number]  PCP:  [facility]  Cardiologist:  Shelva Majestic, MD   No chief complaint on file.  F/U office visit with me following his STEMI  History of Present Illness:  Dannis Deroche is a 63 y.o. male who presents to the office for a 35-monthfollow-up cardiology evaluation.    Mr. GMuellnerhas a history of hypertension and remotely had been on cholesterol medication which was discontinued approximately 5 years ago.  He is an avid cyclist and exercises regularly.  On the morning of March 16, 2018 he developed new onset chest pressure.  His chest pressure wax and wane throughout the entire day and he ultimately presented to the hospital for persistent comfort in the evening.  He was found to have initial inferolateral T wave inversion and ongoing chest pain requiring increasing doses of nitroglycerin.  Initial troponin was positive at 4.26.  As result I took it urgently to the cardiac catheterization laboratory where he was found to have an acute coronary system  secondary to total occlusion of the mid RCA with very faint left to right collaterals.  There was mild coronary calcification.  The LAD had 20% proximal narrowing.  There was midsystolic bridging of the mid LAD with narrowing up to at least 50% during systole.  He had a normal ramus intermediate and circumflex vessel.  His RCA had 30% proximal stenosis and was totally occluded in its mid segment proximal to the takeoff of an acute marginal branch.  Once initial flow was established there was significant spasm in the mid distal RCA which ultimately improved to TIMI-3 flow following 100 mcg of intracoronary nitroglycerin and successful revascularization.  He was discharged 2 days later.  EF on echo was approximately 50% to 55% the day after his MI and he had normal wall motion.  He saw [name] PA-C for initial post  hospital evaluation and amlodipine 2.5 mg mg was added to his regimen.  Over the past 3 months he has continued to do well.  He is now biking typically up to 2 hours at a time.  He denies any chest pain.  He denies any shortness of breath.  He denies palpitations.  He has been taking atorvastatin 80 mg.  His initial LDL during his hospitalization was 154 which improved to 64 with therapy.    I saw him for my initial office evaluation on July 01, 2018.  At that time he was remaining stable without recurrent chest pain symptomatology.  Is exercising regularly and often may ride his bike for up to 2 to 3 hours at a time.  For the past several months, he has continued to be stable.  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  He continues to be on low-dose amlodipine 2.5 mg and lisinopril 2.5 mg daily post MI.  He continues to be on aspirin and Brilinta following his MI for DAPT treatment.  He is on atorvastatin 80 mg for hyperlipidemia.  He underwent recent laboratory 2 weeks ago.  Total cholesterol was 130, LDL 58, triglycerides 96 and HDL 53.  He presents for follow-up evaluation.   Past Medical History:  Diagnosis Date  . Hyperlipidemia   . Hypertension   .  NSTEMI (non-ST elevated myocardial infarction) ([place])    03/16/18 PCI/DES to mRCA, normal EF    Past Surgical History:  Procedure Laterality Date  . CORONARY/GRAFT ACUTE MI REVASCULARIZATION N/A 03/16/2018   Procedure: Coronary/Graft Acute MI Revascularization;  Surgeon: Troy Sine, MD;  Location: [facility];  Service: Cardiovascular;  Laterality: N/A;  . LEFT HEART CATH AND CORONARY ANGIOGRAPHY N/A 03/16/2018   Procedure: LEFT HEART CATH AND CORONARY ANGIOGRAPHY;  Surgeon: Troy Sine, MD;  Location: [facility];  Service: Cardiovascular;  Laterality: N/A;    Current  Medications: Outpatient Medications Prior to Visit  Medication Sig Dispense Refill  . amLODipine (NORVASC) 2.5 MG tablet Take 1 tablet (2.5 mg total) by mouth daily. 180 tablet 3  . aspirin 81 MG chewable tablet Chew 1 tablet (81 mg total) by mouth daily.    Marland Kitchen atorvastatin (LIPITOR) 80 MG tablet Take 1 tablet (80 mg total) by mouth daily at 6 PM. 30 tablet 2  . Lactobacillus (DIGESTIVE HEALTH PROBIOTIC PO) Take 1 tablet by mouth daily.    Marland Kitchen lisinopril (PRINIVIL,ZESTRIL) 2.5 MG tablet Take 1 tablet (2.5 mg total) by mouth daily. 30 tablet 2  . nitroGLYCERIN (NITROSTAT) 0.4 MG SL tablet Place 1 tablet (0.4 mg total) under the tongue every 5 (five) minutes x 3 doses as needed for chest pain. 25 tablet 0  . ticagrelor (BRILINTA) 90 MG TABS tablet Take 1 tablet (90 mg total) by mouth 2 (two) times daily. 60 tablet 11   No facility-administered medications prior to visit.      Allergies:   Azithromycin; Bee venom; Ciprofloxacin; and Metronidazole   Social History   Socioeconomic History  . Marital status: Married    Spouse name: Not on file  . Number of children: Not on file  . Years of education: Not on file  . Highest education level: Not on file  Occupational History  . Not on file  Social Needs  . Financial resource strain: Not on file  . Food insecurity:    Worry: Not on file    Inability: Not on file  . Transportation needs:    Medical: Not on file    Non-medical: Not on file  Tobacco Use  . Smoking status: Never Smoker  . Smokeless tobacco: Never Used  Substance and Sexual Activity  . Alcohol use: Yes    Comment: 2 beers a day  . Drug use: Never  . Sexual activity: Yes  Lifestyle  . Physical activity:    Days per week: Not on file    Minutes per session: Not on file  . Stress: Not on file  Relationships  . Social connections:    Talks on phone: Not on file    Gets together: Not on file    Attends religious service: Not on file    Active member of club or  organization: Not on file    Attends meetings of clubs or organizations: Not on file    Relationship status: Not on file  Other Topics Concern  . Not on file  Social History Narrative  . Not on file     Family History:  The patient's family history includes Parkinson's disease in his father.   ROS General: Negative; No fevers, chills, or night sweats;  HEENT: Negative; No changes in vision or hearing, sinus congestion, difficulty swallowing Pulmonary: Negative; No cough, wheezing, shortness of breath, hemoptysis Cardiovascular: Negative; No chest pain, presyncope, syncope, palpitations GI: Negative; No nausea, vomiting,  diarrhea, or abdominal pain GU: Negative; No dysuria, hematuria, or difficulty voiding Musculoskeletal: Negative; no myalgias, joint pain, or weakness Hematologic/Oncology: Negative; no easy bruising, bleeding Endocrine: Negative; no heat/cold intolerance; no diabetes Neuro: Negative; no changes in balance, headaches Skin: Negative; No rashes or skin lesions Psychiatric: Negative; No behavioral problems, depression Sleep: Negative; No snoring, daytime sleepiness, hypersomnolence, bruxism, restless legs, hypnogognic hallucinations, no cataplexy Other comprehensive 14 point system review is negative.   PHYSICAL EXAM:   VS:  BP 132/80   Pulse 61   Ht '6\' 2"'  (1.88 m)   Wt 187 lb 12.8 oz (85.2 kg)   BMI 24.11 kg/m     Pete blood pressure by me was 126/78.  Wt Readings from Last 3 Encounters:  11/12/18 187 lb 12.8 oz (85.2 kg)  07/01/18 184 lb 12.8 oz (83.8 kg)  03/27/18 185 lb (83.9 kg)    General: Alert, oriented, no distress.  Male pattern alopecia Skin: normal turgor, no rashes, warm and dry HEENT: Normocephalic, atraumatic. Pupils equal round and reactive to light; sclera anicteric; extraocular muscles intact;  Nose without nasal septal hypertrophy Mouth/Parynx benign; Mallinpatti scale 2 Neck: No JVD, no carotid bruits; normal carotid upstroke Lungs:  clear to ausculatation and percussion; no wheezing or rales Chest wall: without tenderness to palpitation Heart: PMI not displaced, RRR, s1 s2 normal, 1/6 systolic murmur, no diastolic murmur, no rubs, gallops, thrills, or heaves Abdomen: soft, nontender; no hepatosplenomehaly, BS+; abdominal aorta nontender and not dilated by palpation. Back: no CVA tenderness Pulses 2+ Musculoskeletal: full range of motion, normal strength, no joint deformities Extremities: no clubbing cyanosis or edema, Homan's sign negative  Neurologic: grossly nonfocal; Cranial nerves grossly wnl Psychologic: Normal mood and affect   Studies/Labs Reviewed:   EKG:  EKG is ordered today. ECG (independently read by me): NSR 61; nonspecific T wave abnormality  October 2019 ECG (independently read by me): Sinus bradycardia at 55 bpm.  Inferolateral T wave abnormality.  Preserved R waves.  Normal intervals.  No ectopy.  Recent Labs: BMP Latest Ref Rng & Units 10/24/2018 03/17/2018 03/16/2018  Glucose 65 - 99 mg/dL 97 112(H) 122(H)  BUN 8 - 27 mg/dL '15 10 14  ' Creatinine 0.76 - 1.27 mg/dL 1.15 1.07 1.06  BUN/Creat Ratio 10 - 24 13 - -  Sodium 134 - 144 mmol/L 139 139 136  Potassium 3.5 - 5.2 mmol/L 5.0 4.2 4.4  Chloride 96 - 106 mmol/L 99 105 102  CO2 20 - 29 mmol/L '23 25 25  ' Calcium 8.6 - 10.2 mg/dL 10.1 8.8(L) 9.7     Hepatic Function Latest Ref Rng & Units 10/24/2018 05/30/2018 03/17/2018  Total Protein 6.0 - 8.5 g/dL 6.5 6.4 5.4(L)  Albumin 3.8 - 4.9 g/dL 4.7 4.7 3.5  AST 0 - 40 IU/L 27 31 194(H)  ALT 0 - 44 IU/L 53(H) 57(H) 50(H)  Alk Phosphatase 39 - 117 IU/L 64 57 35(L)  Total Bilirubin 0.0 - 1.2 mg/dL 0.9 0.7 1.0  Bilirubin, Direct 0.00 - 0.40 mg/dL - 0.22 0.2    CBC Latest Ref Rng & Units 10/24/2018 03/18/2018 03/17/2018  WBC 3.4 - 10.8 x10E3/uL 5.6 8.6 11.1(H)  Hemoglobin 13.0 - 17.7 g/dL 15.9 13.2 13.9  Hematocrit 37.5 - 51.0 % 45.8 39.2 40.9  Platelets 150 - 450 x10E3/uL 273 213 228   Lab Results  Component  Value Date   MCV 93 10/24/2018   MCV 95.4 03/18/2018   MCV 93.4 03/17/2018   Lab Results  Component Value Date  TSH 1.460 10/24/2018   No results found for: HGBA1C   BNP No results found for: BNP  ProBNP No results found for: PROBNP   Lipid Panel     Component Value Date/Time   CHOL 130 10/24/2018 0822   TRIG 96 10/24/2018 0822   HDL 53 10/24/2018 0822   CHOLHDL 2.5 10/24/2018 0822   CHOLHDL 3.2 03/16/2018 1753   VLDL 13 03/16/2018 1753   LDLCALC 58 10/24/2018 0822     RADIOLOGY: No results found.   Additional studies/ records that were reviewed today include:  I reviewed his hospitalization, catheterization report, laboratory and subsequent office evaluation. Recent laboratory was reviewed.   ASSESSMENT:    1. Inferior STEMI secondary to RCA occlusion, treated with DES stent March 16, 2018   2. Essential hypertension   3. Hyperlipidemia with target LDL less than 70   4. Myocardial bridge     PLAN:  Mr. Darek Eifler is a very healthy-appearing 63 year old gentleman who suffered an acute coronary syndrome on March 16, 2018 secondary to total occlusion of the mid RCA with faint left-to-right collaterals.  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  Underwent successful intervention with ultimate insertion of a 2.5 x 15 mm Resolute Onyx DES stent postdilated to 2.78 mm with the 100% occlusion being reduced to less than 5% and with resolution of TIMI-3 flow.  His echo Doppler study done the following day showed normalization of LV function without wall motion abnormalities.  Since his ACS event, he has continued to do exceptionally well.  He denies any recurrent anginal symptomatology on his regimen consisting of amlodipine 2.5 mg and lisinopril 2.5 mg daily.  His blood pressure today when rechecked by me was excellent at 126/78.  Is now back to work and has to travel approximately 5 to 7 days every 5 weeks.  I reviewed  his most recent laboratory.  He is tolerating atorvastatin 80 mg.  Most recent lipid studies are excellent with LDL cholesterol which has improved from 153 down to 58.  He continues to be on aspirin and Brilinta 90 mg twice a day.  Presently, he will continue his current medical regimen.  I discussed with him in the future possibly switching him to reduced dose of Brilinta 60 mg twice daily when I see him in 6 months which will be after a year following his ACS event.  We also discussed alternatives.  He continues to exercise regularly and I have encouraged this.  Hopefully with his aggressive lipid-lowering management he will have plaque regression in his concomitant CAD.  I will see him in 6 months he will undergo a chemistry profile and repeat lipid studies.  I will see him for follow-up evaluation at that time.   Medication Adjustments/Labs and Tests Ordered: Current medicines are reviewed at length with the patient today.  Concerns regarding medicines are outlined above.  Medication changes, Labs and Tests ordered today are listed in the Patient Instructions below. Patient Instructions  Medication Instructions:  The current medical regimen is effective;  continue present plan and medications.  If you need a refill on your cardiac medications before your next appointment, please call your pharmacy.   LAB: Fasting labs (CMET, LIPID in 6 months before appointment)  Follow-Up: At [facility], you and your health needs are our priority.  As part of our continuing mission to provide you with exceptional heart care, we have created designated Provider Care Teams.  These Care Teams include your primary Cardiologist (physician) and Advanced Practice Providers (APPs -  Physician Assistants and Nurse Practitioners) who all work together to provide you with the care you need, when you need it. You will need a follow up appointment in 6 months.  Please call our office 2 months in advance to schedule this  appointment.  You may see Shelva Majestic, MD or one of the following Advanced Practice Providers on your designated Care Team: [place], [place] . Fabian Sharp, PA-C        Signed, Shelva Majestic, MD  11/12/2018 9:09 AM    [facility] [address], [place], [place], [place]  [address] Phone: [phone]

## 2019-01-16 ENCOUNTER — Other Ambulatory Visit: Payer: Self-pay | Admitting: Cardiology

## 2019-01-16 NOTE — Telephone Encounter (Signed)
Brilinta 90 mg refilled. 

## 2019-03-28 ENCOUNTER — Other Ambulatory Visit: Payer: Self-pay | Admitting: Physician Assistant

## 2019-06-01 ENCOUNTER — Other Ambulatory Visit: Payer: Self-pay

## 2019-06-01 MED ORDER — LISINOPRIL 2.5 MG PO TABS: 2.5000 mg | ORAL_TABLET | Freq: Every day | ORAL | 0 refills | Status: DC

## 2019-06-01 MED ORDER — ATORVASTATIN CALCIUM 80 MG PO TABS: 80.0000 mg | ORAL_TABLET | Freq: Every day | ORAL | 0 refills | Status: DC

## 2019-06-01 MED ORDER — AMLODIPINE BESYLATE 2.5 MG PO TABS: 2.5000 mg | ORAL_TABLET | Freq: Every day | ORAL | 0 refills | Status: DC

## 2019-06-10 MED ORDER — LISINOPRIL 2.5 MG PO TABS: 2.5000 mg | ORAL_TABLET | Freq: Every day | ORAL | 0 refills | Status: DC

## 2019-07-24 LAB — COMPREHENSIVE METABOLIC PANEL
ALT: 38 IU/L (ref 0–44)
AST: 25 IU/L (ref 0–40)
Albumin/Globulin Ratio: 3.3 — ABNORMAL HIGH (ref 1.2–2.2)
Albumin: 5 g/dL — ABNORMAL HIGH (ref 3.8–4.8)
Alkaline Phosphatase: 59 IU/L (ref 39–117)
BUN/Creatinine Ratio: 12 (ref 10–24)
BUN: 14 mg/dL (ref 8–27)
Bilirubin Total: 0.7 mg/dL (ref 0.0–1.2)
CO2: 25 mmol/L (ref 20–29)
Calcium: 10.1 mg/dL (ref 8.6–10.2)
Chloride: 101 mmol/L (ref 96–106)
Creatinine, Ser: 1.21 mg/dL (ref 0.76–1.27)
GFR calc Af Amer: 74 mL/min/{1.73_m2} (ref >59)
GFR calc non Af Amer: 64 mL/min/{1.73_m2} (ref >59)
Globulin, Total: 1.5 g/dL (ref 1.5–4.5)
Glucose: 94 mg/dL (ref 65–99)
Potassium: 5.4 mmol/L — ABNORMAL HIGH (ref 3.5–5.2)
Sodium: 140 mmol/L (ref 134–144)
Total Protein: 6.5 g/dL (ref 6.0–8.5)

## 2019-07-24 LAB — LIPID PANEL
Chol/HDL Ratio: 2.3 ratio (ref 0.0–5.0)
Cholesterol, Total: 132 mg/dL (ref 100–199)
HDL: 58 mg/dL (ref >39)
LDL Chol Calc (NIH): 59 mg/dL (ref 0–99)
Triglycerides: 75 mg/dL (ref 0–149)
VLDL Cholesterol Cal: 15 mg/dL (ref 5–40)

## 2019-07-26 ENCOUNTER — Other Ambulatory Visit: Payer: Self-pay | Admitting: Cardiovascular Disease

## 2019-07-30 ENCOUNTER — Ambulatory Visit: Payer: BC Managed Care – PPO | Admitting: Cardiovascular Disease

## 2019-07-30 ENCOUNTER — Other Ambulatory Visit: Payer: Self-pay

## 2019-07-30 ENCOUNTER — Encounter: Payer: Self-pay | Admitting: Cardiovascular Disease

## 2019-07-30 DIAGNOSIS — I2510 Atherosclerotic heart disease of native coronary artery without angina pectoris: Secondary | ICD-10-CM

## 2019-07-30 DIAGNOSIS — Z008 Encounter for other general examination: Secondary | ICD-10-CM | POA: Diagnosis not present

## 2019-07-30 DIAGNOSIS — I10 Essential (primary) hypertension: Secondary | ICD-10-CM | POA: Diagnosis not present

## 2019-07-30 DIAGNOSIS — E785 Hyperlipidemia, unspecified: Secondary | ICD-10-CM | POA: Diagnosis not present

## 2019-07-30 MED ORDER — TICAGRELOR 60 MG PO TABS: 60.0000 mg | ORAL_TABLET | Freq: Two times a day (BID) | ORAL | 3 refills | Status: DC

## 2019-07-30 MED ORDER — NITROGLYCERIN 0.4 MG SL SUBL: 0.4000 mg | SUBLINGUAL_TABLET | SUBLINGUAL | 0 refills | Status: DC | PRN

## 2019-07-30 NOTE — Patient Instructions (Signed)
Medication Instructions:  Your physician has recommended you make the following change in your medication:   Blaine REFILL HAS BEEN SENT TO YOUR PHARMACY  *If you need a refill on your cardiac medications before your next appointment, please call your pharmacy*  Lab Work: NONE If you have labs (blood work) drawn today and your tests are completely normal, you will receive your results only by: Marland Kitchen MyChart Message (if you have MyChart) OR . A paper copy in the mail If you have any lab test that is abnormal or we need to change your treatment, we will call you to review the results.  Testing/Procedures: NONE  Follow-Up: At [facility], you and your health needs are our priority.  As part of our continuing mission to provide you with exceptional heart care, we have created designated Provider Care Teams.  These Care Teams include your primary Cardiologist (physician) and Advanced Practice Providers (APPs -  Physician Assistants and Nurse Practitioners) who all work together to provide you with the care you need, when you need it.  Your next appointment:   6 months  The format for your next appointment:   In Person  Provider:   Shelva Majestic, MD

## 2019-07-30 NOTE — Progress Notes (Signed)
Cardiology Office Note    Date:  08/01/2019   ID:  Elijah Baker, Elijah 1955/12/13, MRN [id number]  PCP:  [facility]  Cardiologist:  Shelva Majestic, MD   Chief Complaint  Patient presents with  . Follow-up   F/U office visit with me following his STEMI  History of Present Illness:  Elijah Baker is a 63 y.o. male who presents to the office for a 58-monthfollow-up cardiology evaluation.    [name] a history of hypertension and remotely had been on cholesterol medication which was discontinued approximately 5 years ago.  He is an avid cyclist and exercises regularly.  On the morning of March 16, 2018 he developed new onset chest pressure.  His chest pressure wax and wane throughout the entire day and he ultimately presented to the hospital for persistent comfort in the evening.  He was found to have initial inferolateral T wave inversion and ongoing chest pain requiring increasing doses of nitroglycerin.  Initial troponin was positive at 4.26.  As result I took it urgently to the cardiac catheterization laboratory where he was found to have an acute coronary system  secondary to total occlusion of the mid RCA with very faint left to right collaterals.  There was mild coronary calcification.  The LAD had 20% proximal narrowing.  There was midsystolic bridging of the mid LAD with narrowing up to at least 50% during systole.  He had a normal ramus intermediate and circumflex vessel.  His RCA had 30% proximal stenosis and was totally occluded in its mid segment proximal to the takeoff of an acute marginal branch.  Once initial flow was established there was significant spasm in the mid distal RCA which ultimately improved to TIMI-3 flow following 100 mcg of intracoronary nitroglycerin and successful revascularization.  He was discharged 2 days later.  EF on echo was approximately 50% to 55% the day after his MI and he had normal wall motion.  He saw Elijah Deforest PA-C for initial post hospital evaluation and amlodipine 2.5 mg mg was added to his regimen.  Over the past 3 months he has continued to do well.  He is now biking typically up to 2 hours at a time.  He denies any chest pain.  He denies any shortness of breath.  He denies palpitations.  He has been taking atorvastatin 80 mg.  His initial LDL during his hospitalization was 154 which improved to 64 with therapy.    I saw him for my initial office evaluation on July 01, 2018.  At that time he was remaining stable without recurrent chest pain symptomatology.  I  I saw him on November 12, 2018 for follow-up evaluation.  At the time he was exercising regularly and often may ride his bike for up to 2 to 3 hours at a time.  For the past several months, he has continued to be stable.  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  He continues to be on low-dose amlodipine 2.5 mg and lisinopril 2.5 mg daily post MI.  He continues to be on aspirin and Brilinta following his MI for DAPT treatment.  He is on atorvastatin 80 mg for hyperlipidemia.  He underwent recent laboratory 2 weeks ago.  Total cholesterol was 130, LDL 58, triglycerides 96 and HDL  53.   Since I last saw him, he has remained cardiovascularly stable.  With the COVID-19 pandemic, he initially worked exclusively at home but now is back in the office setting with very safe parameters.  He is not reinstituted any travel back to [place].  He denies chest pain PND orthopnea.  He denies palpitations presyncope or syncope.  He continues to ride his bike and remains asymptomatic.  Past Medical History:  Diagnosis Date  . Hyperlipidemia   . Hypertension   . NSTEMI (non-ST elevated myocardial infarction) ([place])    03/16/18 PCI/DES to mRCA, normal EF    Past Surgical History:  Procedure Laterality Date  .  CORONARY/GRAFT ACUTE MI REVASCULARIZATION N/A 03/16/2018   Procedure: Coronary/Graft Acute MI Revascularization;  Surgeon: Troy Sine, MD;  Location: [facility];  Service: Cardiovascular;  Laterality: N/A;  . LEFT HEART CATH AND CORONARY ANGIOGRAPHY N/A 03/16/2018   Procedure: LEFT HEART CATH AND CORONARY ANGIOGRAPHY;  Surgeon: Troy Sine, MD;  Location: [facility];  Service: Cardiovascular;  Laterality: N/A;    Current Medications: Outpatient Medications Prior to Visit  Medication Sig Dispense Refill  . amLODipine (NORVASC) 2.5 MG tablet Take 1 tablet (2.5 mg total) by mouth daily. 180 tablet 0  . aspirin 81 MG chewable tablet Chew 1 tablet (81 mg total) by mouth daily.    Marland Kitchen atorvastatin (LIPITOR) 80 MG tablet TAKE 1 TABLET(80 MG) BY MOUTH DAILY AT 6 PM 60 tablet 0  . Lactobacillus (DIGESTIVE HEALTH PROBIOTIC PO) Take 1 tablet by mouth daily.    Marland Kitchen lisinopril (ZESTRIL) 2.5 MG tablet Take 1 tablet (2.5 mg total) by mouth daily. 60 tablet 0  . BRILINTA 90 MG TABS tablet TAKE 1 TABLET BY MOUTH TWICE DAILY 60 tablet 11  . nitroGLYCERIN (NITROSTAT) 0.4 MG SL tablet Place 1 tablet (0.4 mg total) under the tongue every 5 (five) minutes x 3 doses as needed for chest pain. 25 tablet 0   No facility-administered medications prior to visit.      Allergies:   Azithromycin, Bee venom, Ciprofloxacin, and Metronidazole   Social History   Socioeconomic History  . Marital status: Married    Spouse name: Not on file  . Number of children: Not on file  . Years of education: Not on file  . Highest education level: Not on file  Occupational History  . Not on file  Social Needs  . Financial resource strain: Not on file  . Food insecurity    Worry: Not on file    Inability: Not on file  . Transportation needs    Medical: Not on file    Non-medical: Not on file  Tobacco Use  . Smoking status: Never Smoker  . Smokeless tobacco: Never Used  Substance and Sexual Activity  .  Alcohol use: Yes    Comment: 2 beers a day  . Drug use: Never  . Sexual activity: Yes  Lifestyle  . Physical activity    Days per week: Not on file    Minutes per session: Not on file  . Stress: Not on file  Relationships  . Social Herbalist on phone: Not on file    Gets together: Not on file    Attends religious service: Not on file    Active member of club or organization: Not on file    Attends meetings of clubs or organizations: Not on file    Relationship status: Not on file  Other  Topics Concern  . Not on file  Social History Narrative  . Not on file     Family History:  The patient's family history includes Parkinson's disease in his father.   ROS General: Negative; No fevers, chills, or night sweats;  HEENT: Negative; No changes in vision or hearing, sinus congestion, difficulty swallowing Pulmonary: Negative; No cough, wheezing, shortness of breath, hemoptysis Cardiovascular: Negative; No chest pain, presyncope, syncope, palpitations GI: Negative; No nausea, vomiting, diarrhea, or abdominal pain GU: Negative; No dysuria, hematuria, or difficulty voiding Musculoskeletal: Negative; no myalgias, joint pain, or weakness Hematologic/Oncology: Negative; no easy bruising, bleeding Endocrine: Negative; no heat/cold intolerance; no diabetes Neuro: Negative; no changes in balance, headaches Skin: Negative; No rashes or skin lesions Psychiatric: Negative; No behavioral problems, depression Sleep: Negative; No snoring, daytime sleepiness, hypersomnolence, bruxism, restless legs, hypnogognic hallucinations, no cataplexy Other comprehensive 14 point system review is negative.   PHYSICAL EXAM:   VS:  BP 132/78 (BP Location: Left Arm, Patient Position: Sitting, Cuff Size: Normal)   Pulse 68   Temp 97.7 F (36.5 C)   Ht _0  (1.905 m)   Wt 187 lb (84.8 kg)   BMI 23.37 kg/m     Repeat blood pressure by me was 124/78  Wt Readings from Last 3 Encounters:   07/30/19 187 lb (84.8 kg)  11/12/18 187 lb 12.8 oz (85.2 kg)  07/01/18 184 lb 12.8 oz (83.8 kg)    .General: Alert, oriented, no distress.  Very healthy-appearing Skin: normal turgor, no rashes, warm and dry HEENT: Normocephalic, atraumatic. Pupils equal round and reactive to light; sclera anicteric; extraocular muscles intact;  Nose without nasal septal hypertrophy Mouth/Parynx benign; Mallinpatti scale 2 Neck: No JVD, no carotid bruits; normal carotid upstroke Lungs: clear to ausculatation and percussion; no wheezing or rales Chest wall: without tenderness to palpitation Heart: PMI not displaced, RRR, s1 s2 normal, 1/6 systolic murmur, no diastolic murmur, no rubs, gallops, thrills, or heaves Abdomen: soft, nontender; no hepatosplenomehaly, BS+; abdominal aorta nontender and not dilated by palpation. Back: no CVA tenderness Pulses 2+ Musculoskeletal: full range of motion, normal strength, no joint deformities Extremities: no clubbing cyanosis or edema, Homan's sign negative  Neurologic: grossly nonfocal; Cranial nerves grossly wnl Psychologic: Normal mood and affect    Studies/Labs Reviewed:   EKG:  EKG is ordered today. ECG (independently read by me): Normal sinus rhythm at 68 bpm.  No ectopy.  Normal intervals.  February 2020 ECG (independently read by me): NSR 61; nonspecific T wave abnormality  October 2019 ECG (independently read by me): Sinus bradycardia at 55 bpm.  Inferolateral T wave abnormality.  Preserved R waves.  Normal intervals.  No ectopy.  Recent Labs: BMP Latest Ref Rng & Units 07/23/2019 10/24/2018 03/17/2018  Glucose 65 - 99 mg/dL 94 97 112(H)  BUN 8 - 27 mg/dL _1 Creatinine 0.76 - 1.27 mg/dL 1.21 1.15 1.07  BUN/Creat Ratio 10 - _2 -  Sodium 134 - 144 mmol/L 140 139 139  Potassium 3.5 - 5.2 mmol/L 5.4(H) 5.0 4.2  Chloride 96 - 106 mmol/L 101 99 105  CO2 20 - 29 mmol/L _3 Calcium 8.6 - 10.2 mg/dL 10.1 10.1 8.8(L)     Hepatic Function  Latest Ref Rng & Units 07/23/2019 10/24/2018 05/30/2018  Total Protein 6.0 - 8.5 g/dL 6.5 6.5 6.4  Albumin 3.8 - 4.8 g/dL 5.0(H) 4.7 4.7  AST 0 - 40 IU/L _4 ALT 0 - 44  IU/L 38 53(H) 57(H)  Alk Phosphatase 39 - 117 IU/L 59 64 57  Total Bilirubin 0.0 - 1.2 mg/dL 0.7 0.9 0.7  Bilirubin, Direct 0.00 - 0.40 mg/dL - - 0.22    CBC Latest Ref Rng & Units 10/24/2018 03/18/2018 03/17/2018  WBC 3.4 - 10.8 x10E3/uL 5.6 8.6 11.1(H)  Hemoglobin 13.0 - 17.7 g/dL 15.9 13.2 13.9  Hematocrit 37.5 - 51.0 % 45.8 39.2 40.9  Platelets 150 - 450 x10E3/uL 273 213 228   Lab Results  Component Value Date   MCV 93 10/24/2018   MCV 95.4 03/18/2018   MCV 93.4 03/17/2018   Lab Results  Component Value Date   TSH 1.460 10/24/2018   No results found for: HGBA1C   BNP No results found for: BNP  ProBNP No results found for: PROBNP   Lipid Panel     Component Value Date/Time   CHOL 132 07/23/2019 0827   TRIG 75 07/23/2019 0827   HDL 58 07/23/2019 0827   CHOLHDL 2.3 07/23/2019 0827   CHOLHDL 3.2 03/16/2018 1753   VLDL 13 03/16/2018 1753   LDLCALC 59 07/23/2019 0827     RADIOLOGY: No results found.   Additional studies/ records that were reviewed today include:  I reviewed his hospitalization, catheterization report, laboratory and subsequent office evaluation. Recent laboratory was reviewed.   ASSESSMENT:    1. Inferior STEMI secondary to RCA occlusion, treated with DES stent March 16, 2018   2. Essential hypertension   3. Hyperlipidemia with target LDL less than 70   4. Myocardial bridge     PLAN:  Mr. Elijah Baker is a very healthy-appearing 63 year old gentleman who suffered an acute coronary syndrome on March 16, 2018 secondary to total occlusion of the mid RCA with faint left-to-right collaterals.  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  He underwent successful intervention with ultimate insertion of a 2.5 x 15 mm Resolute Onyx  DES stent postdilated to 2.78 mm with the 100% occlusion being reduced to less than 5% and with resolution of TIMI-3 flow.  His echo Doppler study done the following day showed normalization of LV function without wall motion abnormalities.  Since his acute coronary syndrome, he has continued to remain cardiovascularly stable.  He specifically denies any recurrent anginal symptomatology.  He continues to exercise regularly.  His blood pressure today is well controlled on his regimen consisting of amlodipine 2.5 mg and lisinopril 2.5 mg.  He is on DAPT with aspirin and Brilinta.  He is now over 12 months since his ACS event.  Particularly in this COVID-19 pandemic, I have recommended continuation of DAPT.  I discussed with him the Pegasys trial data.  I will decrease his Brilinta to 60 mg twice a day per Pegasys trial for bleed risk reduction.  He continues to be on atorvastatin 80 mg for hyperlipidemia.  Most recent LDL cholesterol in February 2020 was 58 with total cholesterol 132.  We had a lengthy discussion about international travel.  Particularly with the spike in coronavirus and the uncertainty of increased risk exposure with airplane flying, and being in a foreign country with potential different risk, for now I have recommended he try to hold off interventional trial if possible particularly in light of his cardiovascular history in the event he were to contact Covid in a foreign country.  He will continue current therapy.  I will see him in 6 months for reevaluation or sooner if problems  arise.   Medication Adjustments/Labs and Tests Ordered: Current medicines are reviewed at length with the patient today.  Concerns regarding medicines are outlined above.  Medication changes, Labs and Tests ordered today are listed in the Patient Instructions below. Patient Instructions  Medication Instructions:  Your physician has recommended you make the following change in your medication:   [place] REFILL HAS BEEN SENT TO YOUR PHARMACY  *If you need a refill on your cardiac medications before your next appointment, please call your pharmacy*  Lab Work: NONE If you have labs (blood work) drawn today and your tests are completely normal, you will receive your results only by: Marland Kitchen MyChart Message (if you have MyChart) OR . A paper copy in the mail If you have any lab test that is abnormal or we need to change your treatment, we will call you to review the results.  Testing/Procedures: NONE  Follow-Up: At [facility], you and your health needs are our priority.  As part of our continuing mission to provide you with exceptional heart care, we have created designated Provider Care Teams.  These Care Teams include your primary Cardiologist (physician) and Advanced Practice Providers (APPs -  Physician Assistants and Nurse Practitioners) who all work together to provide you with the care you need, when you need it.  Your next appointment:   6 months  The format for your next appointment:   In Person  Provider:   Shelva Majestic, MD       Signed, Shelva Majestic, MD  08/01/2019 9:17 AM    [facility] [address] [place], [place], [place]  [address] Phone: [phone]

## 2019-07-31 ENCOUNTER — Other Ambulatory Visit: Payer: Self-pay

## 2019-07-31 DIAGNOSIS — Z20822 Contact with and (suspected) exposure to covid-19: Secondary | ICD-10-CM

## 2019-08-01 ENCOUNTER — Encounter: Payer: Self-pay | Admitting: Cardiovascular Disease

## 2019-08-03 LAB — NOVEL CORONAVIRUS, NAA: SARS-CoV-2, NAA: NOT DETECTED

## 2019-08-11 MED ORDER — LISINOPRIL 2.5 MG PO TABS: 2.5000 mg | ORAL_TABLET | Freq: Every day | ORAL | 3 refills | Status: DC

## 2019-08-17 ENCOUNTER — Other Ambulatory Visit: Payer: Self-pay | Admitting: Cardiovascular Disease

## 2019-08-17 DIAGNOSIS — Z79899 Other long term (current) drug therapy: Secondary | ICD-10-CM

## 2019-09-19 ENCOUNTER — Other Ambulatory Visit: Payer: Self-pay | Admitting: Cardiovascular Disease

## 2019-09-29 ENCOUNTER — Other Ambulatory Visit: Payer: Self-pay | Admitting: *Deleted

## 2019-09-29 MED ORDER — ATORVASTATIN CALCIUM 80 MG PO TABS: ORAL_TABLET | ORAL | 3 refills | Status: DC

## 2019-11-16 ENCOUNTER — Other Ambulatory Visit: Payer: Self-pay | Admitting: *Deleted

## 2019-11-16 MED ORDER — LISINOPRIL 2.5 MG PO TABS: 2.5000 mg | ORAL_TABLET | Freq: Every day | ORAL | 1 refills | Status: DC

## 2019-12-07 ENCOUNTER — Telehealth: Payer: Self-pay | Admitting: Cardiovascular Disease

## 2019-12-07 NOTE — Telephone Encounter (Signed)
      [facility] Pre-operative Risk Assessment    Request for surgical clearance:  1. What type of surgery is being performed? colonoscopy   2. When is this surgery scheduled? 12/11/2019  3. What type of clearance is required (medical clearance vs. Pharmacy clearance to hold med vs. Both)? pharmacy  4. Are there any medications that need to be held prior to surgery and how long? Hold 4 doses of Brilinta, so he needs to stop by 12/09/2019.   5. Practice name and name of physician performing surgery? [facility], Dr. Glennon Hamilton  6. What is your office phone number: [phone]   7.   What is your office fax number: [phone]  8.   Anesthesia type (None, local, MAC, general) ? propofol   Selena Zobro 12/07/2019, 2:51 PM  _________________________________________________________________   (provider comments below)  Estill Bamberg from [facility] states they need to know if the patient can hold his Brilinta by tomorrow 12/08/2019.

## 2019-12-07 NOTE — Telephone Encounter (Signed)
Dr. Tresa Endo,   Mr. Elijah Baker is scheduled to have a colonoscopy on 12/11/2019? We are being asked if he can hold his Brilinta for 4 doses prior to procedure. They need an answer by tomorrow. Is this OK with you? He has a history of CAD with inferior STEMI in 02/2018 s/p DES to mid RCA. He has done well from a cardiac standpoint since then. You last saw him in 07/2019 and he denied any cardiac symptoms.   Please route response back to [facility] PREOP.  Thank you! Jaxen Samples

## 2019-12-07 NOTE — Telephone Encounter (Signed)
Called and spoke with pt. Notified that per [facility] ideally pts hold the Brilinta for the full 5 days prior to the procedure. Notified that he could call his GI doctor tomorrow to discuss with him, but that Dr.Kelly would be concerned that if any polyps needed to be removed that there is an increased risk for bleeding.  Pt stated that he had already taken his evening dose of Brilinta  and was deferring to [facility] on this and that he would rather do what he says. States he will call his doctor tomorrow to reschedule this procedure. Pt notified that if he were to reschedule a procedure for a Friday he would have to hold his Brilinta starting on the Sunday prior. Pt verbalized understanding and was very thankful for our help. Pt had no further questions at this time.

## 2019-12-07 NOTE — Telephone Encounter (Signed)
Pt should hold brilinta for 5 days prior to procedure

## 2019-12-07 NOTE — Telephone Encounter (Signed)
Attempted to call pt to notify of Dr.Kelly's recommendations regarding holding his Brilinta prior to his colonoscopy. Dr.Kelly says that ideally they should hold Brilinta for 5 days prior to procedures. He suggest that he hold his does of Brilinta this eveing and check back with his GI doctor performing the colonoscopy in the morning and with [place] as well.

## 2019-12-08 NOTE — Telephone Encounter (Signed)
   Primary Cardiologist: Nicki Guadalajara, MD  Chart reviewed as part of pre-operative protocol coverage. Patient has colonoscopy planned for 11/13/2019 and we were asked to give or recommendations for holding Brilinta.  Initial request was to hold Brilinta for 4 doses prior to procedure. However, Dr. Tresa Endo recommends holding Brilinta for a full 5 days prior due to increased risk of bleeding if there are any polyps that need to be removed. Our office did call the patient on 12/07/2019 to discuss these recommendations and he stated he had already taken his evening dose of Brilinta for the day. Patient stated he would call Dr. Janalyn Shy office to reschedule procedure.    I will route this recommendation to the requesting party via Epic fax function and remove from pre-op pool.  Please call with questions.  Corrin Parker, PA-C 12/08/2019, 7:44 AM

## 2020-03-02 MED ORDER — TICAGRELOR 60 MG PO TABS: 60.0000 mg | ORAL_TABLET | Freq: Two times a day (BID) | ORAL | 3 refills | Status: DC

## 2020-04-13 ENCOUNTER — Other Ambulatory Visit: Payer: Self-pay | Admitting: Cardiovascular Disease

## 2020-07-19 ENCOUNTER — Other Ambulatory Visit: Payer: Self-pay | Admitting: Cardiovascular Disease

## 2020-08-31 ENCOUNTER — Other Ambulatory Visit: Payer: Self-pay

## 2020-08-31 MED ORDER — ATORVASTATIN CALCIUM 80 MG PO TABS: ORAL_TABLET | ORAL | 1 refills | Status: DC

## 2020-09-20 ENCOUNTER — Encounter: Payer: Self-pay | Admitting: Cardiovascular Disease

## 2020-09-20 ENCOUNTER — Other Ambulatory Visit: Payer: Self-pay

## 2020-09-20 ENCOUNTER — Ambulatory Visit: Payer: BC Managed Care – PPO | Admitting: Cardiovascular Disease

## 2020-09-20 VITALS — BP 152/76 | HR 58 | Ht 74.0 in | Wt 191.8 lb

## 2020-09-20 DIAGNOSIS — I10 Essential (primary) hypertension: Secondary | ICD-10-CM

## 2020-09-20 DIAGNOSIS — I2510 Atherosclerotic heart disease of native coronary artery without angina pectoris: Secondary | ICD-10-CM | POA: Diagnosis not present

## 2020-09-20 DIAGNOSIS — E785 Hyperlipidemia, unspecified: Secondary | ICD-10-CM

## 2020-09-20 DIAGNOSIS — I214 Non-ST elevation (NSTEMI) myocardial infarction: Secondary | ICD-10-CM | POA: Diagnosis not present

## 2020-09-20 MED ORDER — LISINOPRIL 5 MG PO TABS: 5.0000 mg | ORAL_TABLET | Freq: Every day | ORAL | 3 refills | Status: DC

## 2020-09-20 MED ORDER — CLOPIDOGREL BISULFATE 75 MG PO TABS: 75.0000 mg | ORAL_TABLET | Freq: Every day | ORAL | 3 refills | Status: DC

## 2020-09-20 NOTE — Patient Instructions (Signed)
Medication Instructions:  STOP Brilinta START taking clopidogrel 75 mg once daily (except the first day, when you can take 2 pills) INCREASE lisinopril to 5 mg once a day *If you need a refill on your cardiac medications before your next appointment, please call your pharmacy*   Lab Work: BMET at your convenience in a few weeks If you have labs (blood work) drawn today and your tests are completely normal, you will receive your results only by: Marland Kitchen MyChart Message (if you have MyChart) OR . A paper copy in the mail If you have any lab test that is abnormal or we need to change your treatment, we will call you to review the results.   Testing/Procedures: None ordered   Follow-Up: At [facility], you and your health needs are our priority.  As part of our continuing mission to provide you with exceptional heart care, we have created designated Provider Care Teams.  These Care Teams include your primary Cardiologist (physician) and Advanced Practice Providers (APPs -  Physician Assistants and Nurse Practitioners) who all work together to provide you with the care you need, when you need it.  We recommend signing up for the patient portal called "MyChart".  Sign up information is provided on this After Visit Summary.  MyChart is used to connect with patients for Virtual Visits (Telemedicine).  Patients are able to view lab/test results, encounter notes, upcoming appointments, etc.  Non-urgent messages can be sent to your provider as well.   To learn more about what you can do with MyChart, go to [email or web address].    Your next appointment:   12 month(s)  The format for your next appointment:   In Person  Provider:    Dr. Nicki Guadalajara, MD  Your physician wants you to follow-up in: 12 months. You will receive a reminder letter in the mail two months in advance. If you don't receive a letter, please call our office to schedule the follow-up appointment.     Other  Instructions None

## 2020-09-21 ENCOUNTER — Encounter: Payer: Self-pay | Admitting: Cardiovascular Disease

## 2020-09-21 NOTE — Progress Notes (Signed)
Cardiology Office Note    Date:  09/21/2020   ID:  [name], [name] Oct 09, 1955, MRN [id number]  PCP:  [facility]  Cardiologist:  Shelva Majestic, MD   No chief complaint on file.  F/U office visit with me following his STEMI  History of Present Illness:  Elijah Baker is a 65 y.o. male who presents to the office for a 24-month follow-up cardiology evaluation.    Elijah Baker has a history of hypertension and remotely had been on cholesterol medication which was discontinued approximately 5 years ago.  He is an avid cyclist and exercises regularly.  On the morning of March 16, 2018 he developed new onset chest pressure.  His chest pressure wax and wane throughout the entire day and he ultimately presented to the hospital for persistent comfort in the evening.  He was found to have initial inferolateral T wave inversion and ongoing chest pain requiring increasing doses of nitroglycerin.  Initial troponin was positive at 4.26.  As result I took it urgently to the cardiac catheterization laboratory where he was found to have an acute coronary system  secondary to total occlusion of the mid RCA with very faint left to right collaterals.  There was mild coronary calcification.  The LAD had 20% proximal narrowing.  There was midsystolic bridging of the mid LAD with narrowing up to at least 50% during systole.  He had a normal ramus intermediate and circumflex vessel.  His RCA had 30% proximal stenosis and was totally occluded in its mid segment proximal to the takeoff of an acute marginal branch.  Once initial flow was established there was significant spasm in the mid distal RCA which ultimately improved to TIMI-3 flow following 100 mcg of intracoronary nitroglycerin and successful revascularization.  He was discharged 2 days later.  EF on echo was approximately 50% to 55% the day after his MI and he had normal wall motion.  He saw Elijah Deforest, PA-C for initial post  hospital evaluation and amlodipine 2.5 mg mg was added to his regimen.  Over the past 3 months he has continued to do well.  He is now biking typically up to 2 hours at a time.  He denies any chest pain.  He denies any shortness of breath.  He denies palpitations.  He has been taking atorvastatin 80 mg.  His initial LDL during his hospitalization was 154 which improved to 64 with therapy.    I saw him for my initial office evaluation on July 01, 2018.  At that time he was remaining stable without recurrent chest pain symptomatology.  I  I saw him on November 12, 2018 for follow-up evaluation.  At the time he was exercising regularly and often may ride his bike for up to 2 to 3 hours at a time.  For the past several months, he has continued to be stable.  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  Despite the high altitude in [place] city he denied any chest tightness.  He continues to be on low-dose amlodipine 2.5 mg and lisinopril 2.5 mg daily post MI.  He continues to be on aspirin and Brilinta following his MI for DAPT treatment.  He is on atorvastatin 80 mg for hyperlipidemia.  He underwent recent laboratory 2 weeks ago.  Total cholesterol was 130, LDL 58, triglycerides 96 and HDL 53.   I last  saw him in November 2020 at which time he remained vascularly stable.    With the COVID-19 pandemic, he initially worked exclusively at home but was back in the office setting with very safe parameters.  He is not reinstituted any travel back to [place].  He denies chest pain PND orthopnea.  He denies palpitations presyncope or syncope.  He continues to ride his bike and remains asymptomatic.  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  Is continue to exercise regularly.  Over the Christmas holidays he cycle 5  out of 7 days and continues to do mountain biking typically for 1-1/2 to 2-1/2 hours at a time.  He remains asymptomatic without chest pain or shortness of breath.  He is followed by Kandice Hams at [facility].  He lives in [place].  He had laboratory in August 2021 which was stable.  Hemoglobin 15.3.  Total cholesterol was 162, HDL 69, LDL 76 and triglycerides 83.  Creatinine was 1.2.  LFTs were normal.  States his blood pressure at home typically is in the 128 to [id number] with diastolics at 11-94.  At times he has been having difficulty getting his Brilinta renewed and he had been on low-dose Brilinta.  Apparently he ran out of this medication 1 week ago.  He presents for yearly evaluation.  Past Medical History:  Diagnosis Date  . Hyperlipidemia   . Hypertension   . NSTEMI (non-ST elevated myocardial infarction) ([place])    03/16/18 PCI/DES to mRCA, normal EF    Past Surgical History:  Procedure Laterality Date  . CORONARY/GRAFT ACUTE MI REVASCULARIZATION N/A 03/16/2018   Procedure: Coronary/Graft Acute MI Revascularization;  Surgeon: Troy Sine, MD;  Location: [place] [facility];  Service: Cardiovascular;  Laterality: N/A;  . LEFT HEART CATH AND CORONARY ANGIOGRAPHY N/A 03/16/2018   Procedure: LEFT HEART CATH AND CORONARY ANGIOGRAPHY;  Surgeon: Troy Sine, MD;  Location: [facility];  Service: Cardiovascular;  Laterality: N/A;    Current Medications: Outpatient Medications Prior to Visit  Medication Sig Dispense Refill  . amLODipine (NORVASC) 2.5 MG tablet TAKE 1 TABLET BY MOUTH DAILY 180 tablet 1  . aspirin 81 MG chewable tablet Chew 1 tablet (81 mg total) by mouth daily.    Marland Kitchen atorvastatin (LIPITOR) 80 MG tablet TAKE 1 TABLET(80 MG) BY MOUTH DAILY AT 6 PM 90 tablet 1  . Lactobacillus (DIGESTIVE HEALTH PROBIOTIC PO) Take 1 tablet by mouth daily.    . nitroGLYCERIN (NITROSTAT) 0.4 MG SL tablet Place 1 tablet (0.4 mg total) under the tongue every 5 (five) minutes x 3  doses as needed for chest pain. 25 tablet 0  . lisinopril (ZESTRIL) 2.5 MG tablet TAKE ONE TABLET(2.5 MG) BY MOUTH DAILY 90 tablet 1  . ticagrelor (BRILINTA) 60 MG TABS tablet Take 1 tablet (60 mg total) by mouth 2 (two) times daily. 90 tablet 3   No facility-administered medications prior to visit.     Allergies:   Azithromycin, Bee venom, Ciprofloxacin, and Metronidazole   Social History   Socioeconomic History  . Marital status: Married    Spouse name: Not on file  . Number of children: Not on file  . Years of education: Not on file  . Highest education level: Not on file  Occupational History  . Not on file  Tobacco Use  . Smoking status:  Never Smoker  . Smokeless tobacco: Never Used  Vaping Use  . Vaping Use: Never used  Substance and Sexual Activity  . Alcohol use: Yes    Comment: 2 beers a day  . Drug use: Never  . Sexual activity: Yes  Other Topics Concern  . Not on file  Social History Narrative  . Not on file   Social Determinants of Health   Financial Resource Strain: Not on file  Food Insecurity: Not on file  Transportation Needs: Not on file  Physical Activity: Not on file  Stress: Not on file  Social Connections: Not on file     Family History:  The patient's family history includes Parkinson's disease in his father.   ROS General: Negative; No fevers, chills, or night sweats;  HEENT: Negative; No changes in vision or hearing, sinus congestion, difficulty swallowing Pulmonary: Negative; No cough, wheezing, shortness of breath, hemoptysis Cardiovascular: Negative; No chest pain, presyncope, syncope, palpitations GI: Negative; No nausea, vomiting, diarrhea, or abdominal pain GU: Negative; No dysuria, hematuria, or difficulty voiding Musculoskeletal: Negative; no myalgias, joint pain, or weakness Hematologic/Oncology: Negative; no easy bruising, bleeding Endocrine: Negative; no heat/cold intolerance; no diabetes Neuro: Negative; no changes in  balance, headaches Skin: Negative; No rashes or skin lesions Psychiatric: Negative; No behavioral problems, depression Sleep: Negative; No snoring, daytime sleepiness, hypersomnolence, bruxism, restless legs, hypnogognic hallucinations, no cataplexy Other comprehensive 14 point system review is negative.   PHYSICAL EXAM:   VS:  BP (!) 152/76   Pulse (!) 58   Ht $R'6\' 2"'tH$  (1.88 m)   Wt 191 lb 12.8 oz (87 kg)   BMI 24.63 kg/m     Repeat blood pressure by me was 142/78.  Wt Readings from Last 3 Encounters:  09/20/20 191 lb 12.8 oz (87 kg)  07/30/19 187 lb (84.8 kg)  11/12/18 187 lb 12.8 oz (85.2 kg)    BP (!) 152/76   Pulse (!) 58   Ht $R'6\' 2"'SX$  (1.88 m)   Wt 191 lb 12.8 oz (87 kg)   BMI 24.63 kg/m  General: Alert, oriented, no distress.  Skin: normal turgor, no rashes, warm and dry HEENT: Normocephalic, atraumatic. Pupils equal round and reactive to light; sclera anicteric; extraocular muscles intact; Nose without nasal septal hypertrophy Mouth/Parynx benign; Mallinpatti scale 2 Neck: No JVD, no carotid bruits; normal carotid upstroke Lungs: clear to ausculatation and percussion; no wheezing or rales Chest wall: without tenderness to palpitation Heart: PMI not displaced, RRR, s1 s2 normal, 1/6 systolic murmur, no diastolic murmur, no rubs, gallops, thrills, or heaves Abdomen: soft, nontender; no hepatosplenomehaly, BS+; abdominal aorta nontender and not dilated by palpation. Back: no CVA tenderness Pulses 2+ Musculoskeletal: full range of motion, normal strength, no joint deformities Extremities: no clubbing cyanosis or edema, Homan's sign negative  Neurologic: grossly nonfocal; Cranial nerves grossly wnl Psychologic: Normal mood and affect    Studies/Labs Reviewed:   EKG:  EKG is ordered today. ECG (independently read by me): Sinus bradycardia 58 bpm.  Nonspecific T wave abnormality.  Normal intervals.  No ectopy  November12, 2021 ECG (independently read by me): Normal sinus  rhythm at 68 bpm.  No ectopy.  Normal intervals.  February 2020 ECG (independently read by me): NSR 61; nonspecific T wave abnormality  October 2019 ECG (independently read by me): Sinus bradycardia at 55 bpm.  Inferolateral T wave abnormality.  Preserved R waves.  Normal intervals.  No ectopy.  Recent Labs: BMP Latest Ref Rng & Units 07/23/2019 10/24/2018 03/17/2018  Glucose 65 - 99 mg/dL 94 97 112(H)  BUN 8 - 27 mg/dL $Remove'14 15 10  'waLOBrK$ Creatinine 0.76 - 1.27 mg/dL 1.21 1.15 1.07  BUN/Creat Ratio 10 - $Re'24 12 13 'wTe$ -  Sodium 134 - 144 mmol/L 140 139 139  Potassium 3.5 - 5.2 mmol/L 5.4(H) 5.0 4.2  Chloride 96 - 106 mmol/L 101 99 105  CO2 20 - 29 mmol/L $RemoveB'25 23 25  'PZEintGF$ Calcium 8.6 - 10.2 mg/dL 10.1 10.1 8.8(L)     Hepatic Function Latest Ref Rng & Units 07/23/2019 10/24/2018 05/30/2018  Total Protein 6.0 - 8.5 g/dL 6.5 6.5 6.4  Albumin 3.8 - 4.8 g/dL 5.0(H) 4.7 4.7  AST 0 - 40 IU/L $Remov'25 27 31  'zQpCtQ$ ALT 0 - 44 IU/L 38 53(H) 57(H)  Alk Phosphatase 39 - 117 IU/L 59 64 57  Total Bilirubin 0.0 - 1.2 mg/dL 0.7 0.9 0.7  Bilirubin, Direct 0.00 - 0.40 mg/dL - - 0.22    CBC Latest Ref Rng & Units 10/24/2018 03/18/2018 03/17/2018  WBC 3.4 - 10.8 x10E3/uL 5.6 8.6 11.1(H)  Hemoglobin 13.0 - 17.7 g/dL 15.9 13.2 13.9  Hematocrit 37.5 - 51.0 % 45.8 39.2 40.9  Platelets 150 - 450 x10E3/uL 273 213 228   Lab Results  Component Value Date   MCV 93 10/24/2018   MCV 95.4 03/18/2018   MCV 93.4 03/17/2018   Lab Results  Component Value Date   TSH 1.460 10/24/2018   No results found for: HGBA1C   BNP No results found for: BNP  ProBNP No results found for: PROBNP   Lipid Panel     Component Value Date/Time   CHOL 132 07/23/2019 0827   TRIG 75 07/23/2019 0827   HDL 58 07/23/2019 0827   CHOLHDL 2.3 07/23/2019 0827   CHOLHDL 3.2 03/16/2018 1753   VLDL 13 03/16/2018 1753   LDLCALC 59 07/23/2019 0827     RADIOLOGY: No results found.   Additional studies/ records that were reviewed today include:  I reviewed his  hospitalization, catheterization report, laboratory and subsequent office evaluation. Recent laboratory was reviewed.   ASSESSMENT:    1. Essential hypertension   2. NSTEMI (non-ST elevated myocardial infarction) [facility]): March 16, 2018, DES stent to RCA   3. Coronary artery disease involving native coronary artery of native heart without angina pectoris   4. Hyperlipidemia with target LDL less than 70     PLAN:  Elijah Baker is a very healthy-appearing 65 year old gentleman who suffered an acute coronary syndrome on March 16, 2018 secondary to total occlusion of the mid RCA with faint left-to-right collaterals.  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  He underwent successful intervention with ultimate insertion of a 2.5 x 15 mm Resolute Onyx DES stent postdilated to 2.78 mm with the 100% occlusion being reduced to less than 5% and with resolution of TIMI-3 flow.  His echo Doppler study done the following day showed normalization of LV function without wall motion abnormalities.  Since his acute coronary syndrome, he has continued to remain cardiovascularly stable.  He has been without anginal symptomatology and continues to exercise regularly with significant cycling.  He denies any exertional chest pain or exertional shortness of breath.  When I last saw him I reduced his Brilinta per Pegasys trial dated down to 60 mg twice a day and he continues to be on low-dose aspirin, atorvastatin 80 mg and amlodipine 2.5 mg with lisinopril 2.5 mg daily for hypertension.  His blood pressure today is elevated and I discussed with him most recent hypertensive guidelines.  I have recommended further titration of lisinopril to 5 mg daily.  He ran out of his Brilinta over the past week apparently recently had some difficulty renewing this.  I will switch him to clopidogrel and have suggested the first day take 150 mg and then start 75 mg daily for continued  DAPT.  We discussed potentially in the future just switching to aspirin alone.  I reviewed his most recent laboratory.  He continues to be on atorvastatin 80 mg.  LDL cholesterol was 76 we discussed optimal treatment less than 70.  His primary care physician will be rechecking laboratory.  He will monitor his blood pressure.  Mended a follow-up BMP obtained in several weeks on his increased ACE inhibition.  Over the past year he underwent colonoscopy.  He will monitor his blood pressure at home and let us know if blood pressure continues to be elevated.  As he remains stable I will see him in 1 year for reevaluation or sooner as needed.   Medication Adjustments/Labs and Tests Ordered: Current medicines are reviewed at length with the patient today.  Concerns regarding medicines are outlined above.  Medication changes, Labs and Tests ordered today are listed in the Patient Instructions below. Patient Instructions  Medication Instructions:  STOP Brilinta START taking clopidogrel 75 mg once daily (except the first day, when you can take 2 pills) INCREASE lisinopril to 5 mg once a day *If you need a refill on your cardiac medications before your next appointment, please call your pharmacy*   Lab Work: BMET at your convenience in a few weeks If you have labs (blood work) drawn today and your tests are completely normal, you will receive your results only by: Marland Kitchen MyChart Message (if you have MyChart) OR . A paper copy in the mail If you have any lab test that is abnormal or we need to change your treatment, we will call you to review the results.   Testing/Procedures: None ordered   Follow-Up: At [facility], you and your health needs are our priority.  As part of our continuing mission to provide you with exceptional heart care, we have created designated Provider Care Teams.  These Care Teams include your primary Cardiologist (physician) and Advanced Practice Providers (APPs -  Physician  Assistants and Nurse Practitioners) who all work together to provide you with the care you need, when you need it.  We recommend signing up for the patient portal called "MyChart".  Sign up information is provided on this After Visit Summary.  MyChart is used to connect with patients for Virtual Visits (Telemedicine).  Patients are able to view lab/test results, encounter notes, upcoming appointments, etc.  Non-urgent messages can be sent to your provider as well.   To learn more about what you can do with MyChart, go to [email or web address].    Your next appointment:   12 month(s)  The format for your next appointment:   In Person  Provider:    Dr. Shelva Majestic, MD  Your physician wants you to follow-up in: 12 months. You will receive a reminder letter in the mail two months in advance. If you don't receive a letter, please call our office to schedule the follow-up appointment.     Other Instructions None     Signed, Shelva Majestic, MD  09/21/2020 8:16 AM    [facility] [address], [place],  [place]  [address] Phone: [phone]

## 2021-04-03 MED ORDER — ATORVASTATIN CALCIUM 80 MG PO TABS: 80.0000 mg | ORAL_TABLET | Freq: Every day | ORAL | 2 refills | Status: DC

## 2021-04-26 ENCOUNTER — Other Ambulatory Visit: Payer: Self-pay | Admitting: Cardiovascular Disease

## 2021-08-21 ENCOUNTER — Other Ambulatory Visit: Payer: Self-pay | Admitting: Cardiovascular Disease

## 2021-11-27 ENCOUNTER — Encounter: Payer: Self-pay | Admitting: Cardiovascular Disease

## 2021-11-27 ENCOUNTER — Telehealth: Payer: Self-pay | Admitting: Cardiovascular Disease

## 2021-11-27 MED ORDER — ATORVASTATIN CALCIUM 80 MG PO TABS: 80.0000 mg | ORAL_TABLET | Freq: Every day | ORAL | 0 refills | Status: DC

## 2021-11-27 NOTE — Telephone Encounter (Signed)
Refills has been sen to the pharmacy. 

## 2021-11-27 NOTE — Telephone Encounter (Signed)
? ?*  STAT* If patient is at the pharmacy, call can be transferred to refill team. ? ? ?1. Which medications need to be refilled? (please list name of each medication and dose if known)  ? ?atorvastatin (LIPITOR) 80 MG tablet ? ?2. Which pharmacy/location (including street and city if local pharmacy) is medication to be sent to? ? ?[id number] - [place], [place] - [address] ? ?3. Do they need a 30 day or 90 day supply? 90 ? ? Patient has appt on 03/08/22 ?

## 2022-03-04 ENCOUNTER — Encounter (HOSPITAL_BASED_OUTPATIENT_CLINIC_OR_DEPARTMENT_OTHER): Payer: Self-pay | Admitting: Emergency Medicine

## 2022-03-04 ENCOUNTER — Emergency Department (HOSPITAL_BASED_OUTPATIENT_CLINIC_OR_DEPARTMENT_OTHER): Payer: 59

## 2022-03-04 ENCOUNTER — Emergency Department (HOSPITAL_BASED_OUTPATIENT_CLINIC_OR_DEPARTMENT_OTHER)
Admission: EM | Admit: 2022-03-04 | Discharge: 2022-03-04 | Disposition: A | Discharge status: Home / Self Care | Payer: 59 | Attending: Emergency Medicine | Admitting: Emergency Medicine

## 2022-03-04 ENCOUNTER — Other Ambulatory Visit: Payer: Self-pay

## 2022-03-04 DIAGNOSIS — Z7901 Long term (current) use of anticoagulants: Secondary | ICD-10-CM | POA: Diagnosis not present

## 2022-03-04 DIAGNOSIS — S8992XA Unspecified injury of left lower leg, initial encounter: Secondary | ICD-10-CM | POA: Diagnosis present

## 2022-03-04 DIAGNOSIS — M7122 Synovial cyst of popliteal space [Baker], left knee: Secondary | ICD-10-CM | POA: Diagnosis not present

## 2022-03-04 DIAGNOSIS — Z7982 Long term (current) use of aspirin: Secondary | ICD-10-CM | POA: Diagnosis not present

## 2022-03-04 DIAGNOSIS — M79605 Pain in left leg: Secondary | ICD-10-CM | POA: Insufficient documentation

## 2022-03-04 DIAGNOSIS — Y92828 Other wilderness area as the place of occurrence of the external cause: Secondary | ICD-10-CM | POA: Insufficient documentation

## 2022-03-04 DIAGNOSIS — Z951 Presence of aortocoronary bypass graft: Secondary | ICD-10-CM | POA: Insufficient documentation

## 2022-03-04 DIAGNOSIS — S86812A Strain of other muscle(s) and tendon(s) at lower leg level, left leg, initial encounter: Secondary | ICD-10-CM

## 2022-03-04 DIAGNOSIS — Z79899 Other long term (current) drug therapy: Secondary | ICD-10-CM | POA: Insufficient documentation

## 2022-03-04 DIAGNOSIS — Y9355 Activity, bike riding: Secondary | ICD-10-CM | POA: Diagnosis not present

## 2022-03-04 DIAGNOSIS — S86912A Strain of unspecified muscle(s) and tendon(s) at lower leg level, left leg, initial encounter: Secondary | ICD-10-CM | POA: Insufficient documentation

## 2022-03-04 DIAGNOSIS — M7989 Other specified soft tissue disorders: Secondary | ICD-10-CM

## 2022-03-04 DIAGNOSIS — I10 Essential (primary) hypertension: Secondary | ICD-10-CM | POA: Diagnosis not present

## 2022-03-04 NOTE — ED Triage Notes (Signed)
Pt arrives pov, steady gait, c/o left calf pain x 1 week, worsening over last 2 days. Swelling noted.

## 2022-03-04 NOTE — ED Provider Notes (Signed)
[facility] EMERGENCY DEPARTMENT Provider Note   CSN: [id number] Arrival date & time: 03/04/22  1045     History {Add pertinent medical, surgical, social history, OB history to HPI:1} Chief Complaint  Patient presents with   Leg Swelling    Selby Foisy is a 66 y.o. male.  HPI       Past Medical History:  Diagnosis Date   Hyperlipidemia    Hypertension    NSTEMI (non-ST elevated myocardial infarction) ([facility])    03/16/18 PCI/DES to mRCA, normal EF     Past Surgical History:  Procedure Laterality Date   CORONARY/GRAFT ACUTE MI REVASCULARIZATION N/A 03/16/2018   Procedure: Coronary/Graft Acute MI Revascularization;  Surgeon: Lennette Bihari, MD;  Location: [facility] INVASIVE CV LAB;  Service: Cardiovascular;  Laterality: N/A;   LEFT HEART CATH AND CORONARY ANGIOGRAPHY N/A 03/16/2018   Procedure: LEFT HEART CATH AND CORONARY ANGIOGRAPHY;  Surgeon: Lennette Bihari, MD;  Location: [facility] INVASIVE CV LAB;  Service: Cardiovascular;  Laterality: N/A;    Home Medications Prior to Admission medications   Medication Sig Start Date End Date Taking? Authorizing Provider  amLODipine (NORVASC) 2.5 MG tablet TAKE 1 TABLET BY MOUTH DAILY 04/26/21   Lennette Bihari, MD  aspirin 81 MG chewable tablet Chew 1 tablet (81 mg total) by mouth daily. 03/18/18   Arty Baumgartner, NP  atorvastatin (LIPITOR) 80 MG tablet Take 1 tablet (80 mg total) by mouth daily. KEEP OV. 11/27/21   Lennette Bihari, MD  clopidogrel (PLAVIX) 75 MG tablet TAKE 1 TABLET(75 MG) BY MOUTH DAILY 08/21/21   Lennette Bihari, MD  Lactobacillus (DIGESTIVE HEALTH PROBIOTIC PO) Take 1 tablet by mouth daily.    [provider]  lisinopril (ZESTRIL) 5 MG tablet TAKE 1 TABLET(5 MG) BY MOUTH DAILY 08/21/21   Lennette Bihari, MD  nitroGLYCERIN (NITROSTAT) 0.4 MG SL tablet Place 1 tablet (0.4 mg total) under the tongue every 5 (five) minutes x 3 doses as needed for chest pain. 07/30/19   Lennette Bihari, MD      Allergies     Azithromycin, Bee venom, Ciprofloxacin, and Metronidazole    Review of Systems   Review of Systems  Physical Exam Updated Vital Signs BP 136/74   Pulse (!) 55   Temp 98 F (36.7 C)   Resp 17   Ht 6\' 2"  (1.88 m)   Wt 83.5 kg   SpO2 96%   BMI 23.62 kg/m  Physical Exam  ED Results / Procedures / Treatments   Labs (all labs ordered are listed, but only abnormal results are displayed) Labs Reviewed - No data to display  EKG None  Radiology Venous Img Lower Unilateral Left  Result Date: 03/04/2022 CLINICAL DATA:  66 year old male with LEFT LOWER extremity pain and swelling. EXAM: LEFT LOWER EXTREMITY VENOUS DOPPLER ULTRASOUND TECHNIQUE: Gray-scale sonography with compression, as well as color and duplex ultrasound, were performed to evaluate the deep venous system(s) from the level of the common femoral vein through the popliteal and proximal calf veins. COMPARISON:  None Available. FINDINGS: VENOUS Normal compressibility of the LEFT common femoral, superficial femoral, and popliteal veins, as well as the visualized calf veins. Visualized portions of profunda femoral vein and great saphenous vein unremarkable. No filling defects to suggest DVT on grayscale or color Doppler imaging. Doppler waveforms show normal direction of venous flow, normal respiratory plasticity and response to augmentation. Limited views of the RIGHT common femoral vein are unremarkable. OTHER A 2.6  x 2.8 x 0.9 cm popliteal cyst is noted. Limitations: none IMPRESSION: 1. No evidence of LEFT LOWER extremity DVT. 2. 2.8 cm LEFT popliteal cyst. Electronically Signed   By: Harmon Pier M.D.   On: 03/04/2022 12:33    Procedures Procedures  {Document cardiac monitor, telemetry assessment procedure when appropriate:1}  Medications Ordered in ED Medications - No data to display  ED Course/ Medical Decision Making/ A&P                           Medical Decision Making  ***  {Document critical care time when  appropriate:1} {Document review of labs and clinical decision tools ie heart score, Chads2Vasc2 etc:1}  {Document your independent review of radiology images, and any outside records:1} {Document your discussion with family members, caretakers, and with consultants:1} {Document social determinants of health affecting pt's care:1} {Document your decision making why or why not admission, treatments were needed:1} Final Clinical Impression(s) / ED Diagnoses Final diagnoses:  None    Rx / DC Orders ED Discharge Orders     None

## 2022-03-08 ENCOUNTER — Ambulatory Visit (INDEPENDENT_AMBULATORY_CARE_PROVIDER_SITE_OTHER): Payer: 59 | Admitting: Cardiovascular Disease

## 2022-03-08 ENCOUNTER — Encounter: Payer: Self-pay | Admitting: Cardiovascular Disease

## 2022-03-08 VITALS — BP 122/82 | HR 57 | Ht 74.0 in | Wt 189.4 lb

## 2022-03-08 DIAGNOSIS — I10 Essential (primary) hypertension: Secondary | ICD-10-CM

## 2022-03-08 DIAGNOSIS — I2510 Atherosclerotic heart disease of native coronary artery without angina pectoris: Secondary | ICD-10-CM | POA: Diagnosis not present

## 2022-03-08 DIAGNOSIS — I214 Non-ST elevation (NSTEMI) myocardial infarction: Secondary | ICD-10-CM

## 2022-03-08 DIAGNOSIS — M79662 Pain in left lower leg: Secondary | ICD-10-CM

## 2022-03-08 DIAGNOSIS — E785 Hyperlipidemia, unspecified: Secondary | ICD-10-CM

## 2022-03-08 NOTE — Patient Instructions (Signed)
  Follow-Up: At [facility], you and your health needs are our priority.  As part of our continuing mission to provide you with exceptional heart care, we have created designated Provider Care Teams.  These Care Teams include your primary Cardiologist (physician) and Advanced Practice Providers (APPs -  Physician Assistants and Nurse Practitioners) who all work together to provide you with the care you need, when you need it.  We recommend signing up for the patient portal called "MyChart".  Sign up information is provided on this After Visit Summary.  MyChart is used to connect with patients for Virtual Visits (Telemedicine).  Patients are able to view lab/test results, encounter notes, upcoming appointments, etc.  Non-urgent messages can be sent to your provider as well.   To learn more about what you can do with MyChart, go to [email or web address].    Your next appointment:   12 month(s)  The format for your next appointment:   In Person  Provider:   Nicki Guadalajara, MD       Important Information About Sugar

## 2022-03-08 NOTE — Progress Notes (Signed)
Cardiology Office Note    Date:  03/08/2022   ID:  Baker, Elijah 1956/05/04, MRN [id number]  PCP:  [facility]  Cardiologist:  Shelva Majestic, MD   No chief complaint on file.  23 F/U office visit with me following his STEMI  History of Present Illness:  Elijah Baker is a 66 y.o. male who presents to the office for a 32-monthfollow-up cardiology evaluation.    Mr. GWeimerhas a history of hypertension and remotely had been on cholesterol medication which was discontinued approximately 5 years ago.  He is an avid cyclist and exercises regularly.  On the morning of March 16, 2018 he developed new onset chest pressure.  His chest pressure wax and wane throughout the entire day and he ultimately presented to the hospital for persistent comfort in the evening.  He was found to have initial inferolateral T wave inversion and ongoing chest pain requiring increasing doses of nitroglycerin.  Initial troponin was positive at 4.26.  As result I took it urgently to the cardiac catheterization laboratory where he was found to have an acute coronary system  secondary to total occlusion of the mid RCA with very faint left to right collaterals.  There was mild coronary calcification.  The LAD had 20% proximal narrowing.  There was midsystolic bridging of the mid LAD with narrowing up to at least 50% during systole.  He had a normal ramus intermediate and circumflex vessel.  His RCA had 30% proximal stenosis and was totally occluded in its mid segment proximal to the takeoff of an acute marginal branch.  Once initial flow was established there was significant spasm in the mid distal RCA which ultimately improved to TIMI-3 flow following 100 mcg of intracoronary nitroglycerin and successful revascularization.  He was discharged 2 days later.  EF on echo was approximately 50% to 55% the day after his MI and he had normal wall motion.  He saw [name] PA-C for initial post  hospital evaluation and amlodipine 2.5 mg mg was added to his regimen.  Over the past 3 months he has continued to do well.  He is now biking typically up to 2 hours at a time.  He denies any chest pain.  He denies any shortness of breath.  He denies palpitations.  He has been taking atorvastatin 80 mg.  His initial LDL during his hospitalization was 154 which improved to 64 with therapy.    I saw him for my initial office evaluation on July 01, 2018.  At that time he was remaining stable without recurrent chest pain symptomatology.  I  I saw him on November 12, 2018 for follow-up evaluation.  At the time he was exercising regularly and often may ride his bike for up to 2 to 3 hours at a time.  For the past several months, he has continued to be stable.  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  He continues to be on low-dose amlodipine 2.5 mg and lisinopril 2.5 mg daily post MI.  He continues to be on aspirin and Brilinta following his MI for DAPT treatment.  He is on atorvastatin 80 mg for hyperlipidemia.  He underwent recent laboratory 2 weeks ago.  Total cholesterol was 130, LDL 58, triglycerides 96 and HDL 53.   I  saw him in November 2020 at which time he remained vascularly stable.    With the COVID-19 pandemic, he initially worked exclusively at home but was back in the office setting with very safe parameters.  He is not reinstituted any travel back to [place].  He denies chest pain PND orthopnea.  He denies palpitations presyncope or syncope.  He continues to ride his bike and remains asymptomatic.  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  He continue to exercise regularly.  Over  the Christmas holidays he cycled 5 out of 7 days and continues to do mountain biking typically for 1-1/2 to 2-1/2 hours at a time.  He remains asymptomatic without chest pain or shortness of breath.  He is followed by [facility] at [facility].  He lives in [place].  He had laboratory in August 2021 which was stable.  Hemoglobin 15.3.  Total cholesterol was 162, HDL 69, LDL 76 and triglycerides 83.  Creatinine was 1.2.  LFTs were normal.  States his blood pressure at home typically is in the 128 to [id number] with diastolics at 94-58.  At times he has been having difficulty getting his Brilinta renewed and he had been on low-dose Brilinta.  Apparently he ran out of this medication 1 week ago.  During that evaluation, I switched him to clopidogrel.  Over the past year he has continued to be very active and rides his bicycle 5 to 7 days/week.  He had recently gone to [facility] and was biking vigorous hills on consecutive days.  He subsequently developed soreness in his left calf and ultimately presented to the emergency room on March 04, 2022.  Doppler study was negative for DVT and he was incidentally noted to have a left popliteal cyst.  Presently, he denies any chest pain or shortness of breath.  He states his blood pressure at home typically has systolics in the 592T and diastolics between 75 and 83.  He had undergone laboratory by [facility] in January 2023 which showed total cholesterol at 140, triglycerides 74, HDL 49 and LDL 76.  He had normal renal function with creatinine 1.08.  LFTs were normal.  He admits to some easy bruisability.  He continues to be on aspirin and Plavix, lisinopril 5 mg and low-dose amlodipine 2.5 mg.  He is on atorvastatin 80 mg.  He presents for evaluation and is here with his wife.   Past Medical History:  Diagnosis Date   Hyperlipidemia    Hypertension    NSTEMI (non-ST elevated myocardial infarction) ([facility])    03/16/18 PCI/DES to mRCA, normal EF    Past Surgical  History:  Procedure Laterality Date   CORONARY/GRAFT ACUTE MI REVASCULARIZATION N/A 03/16/2018   Procedure: Coronary/Graft Acute MI Revascularization;  Surgeon: Troy Sine, MD;  Location: [place] [facility];  Service: Cardiovascular;  Laterality: N/A;   LEFT HEART CATH AND CORONARY ANGIOGRAPHY N/A 03/16/2018   Procedure: LEFT HEART CATH AND CORONARY ANGIOGRAPHY;  Surgeon: Troy Sine, MD;  Location: [facility];  Service: Cardiovascular;  Laterality: N/A;    Current Medications: Outpatient Medications Prior to Visit  Medication Sig Dispense Refill   amLODipine (NORVASC) 2.5 MG tablet TAKE 1 TABLET BY MOUTH DAILY 180 tablet 3   aspirin 81 MG chewable tablet Chew 1 tablet (81  mg total) by mouth daily.     atorvastatin (LIPITOR) 80 MG tablet Take 1 tablet (80 mg total) by mouth daily. KEEP OV. 90 tablet 0   clopidogrel (PLAVIX) 75 MG tablet TAKE 1 TABLET(75 MG) BY MOUTH DAILY 90 tablet 3   Lactobacillus (DIGESTIVE HEALTH PROBIOTIC PO) Take 1 tablet by mouth daily.     lisinopril (ZESTRIL) 5 MG tablet TAKE 1 TABLET(5 MG) BY MOUTH DAILY 90 tablet 3   nitroGLYCERIN (NITROSTAT) 0.4 MG SL tablet Place 1 tablet (0.4 mg total) under the tongue every 5 (five) minutes x 3 doses as needed for chest pain. (Patient not taking: Reported on 03/08/2022) 25 tablet 0   No facility-administered medications prior to visit.     Allergies:   Azithromycin, Bee venom, Ciprofloxacin, and Metronidazole   Social History   Socioeconomic History   Marital status: Married    Spouse name: Not on file   Number of children: Not on file   Years of education: Not on file   Highest education level: Not on file  Occupational History   Not on file  Tobacco Use   Smoking status: Never   Smokeless tobacco: Never  Vaping Use   Vaping Use: Never used  Substance and Sexual Activity   Alcohol use: Yes    Comment: 2 beers a day   Drug use: Never   Sexual activity: Yes  Other Topics Concern   Not on file   Social History Narrative   Not on file   Social Determinants of Health   Financial Resource Strain: Not on file  Food Insecurity: Not on file  Transportation Needs: Not on file  Physical Activity: Not on file  Stress: Not on file  Social Connections: Not on file     Family History:  The patient's family history includes Parkinson's disease in his father.   ROS General: Negative; No fevers, chills, or night sweats;  HEENT: Negative; No changes in vision or hearing, sinus congestion, difficulty swallowing Pulmonary: Negative; No cough, wheezing, shortness of breath, hemoptysis Cardiovascular: Negative; No chest pain, presyncope, syncope, palpitations GI: Negative; No nausea, vomiting, diarrhea, or abdominal pain GU: Negative; No dysuria, hematuria, or difficulty voiding Musculoskeletal: Recent left calf discomfort Hematologic/Oncology: Negative; no easy bruising, bleeding Endocrine: Negative; no heat/cold intolerance; no diabetes Neuro: Negative; no changes in balance, headaches Skin: Negative; No rashes or skin lesions Psychiatric: Negative; No behavioral problems, depression Sleep: Negative; No snoring, daytime sleepiness, hypersomnolence, bruxism, restless legs, hypnogognic hallucinations, no cataplexy Other comprehensive 14 point system review is negative.   PHYSICAL EXAM:   VS:  BP 122/82 (BP Location: Left Arm, Patient Position: Sitting, Cuff Size: Normal)   Pulse (!) 57   Ht '6\' 2"'  (1.88 m)   Wt 189 lb 6.4 oz (85.9 kg)   SpO2 96%   BMI 24.32 kg/m     Repeat blood pressure by me was 140/80.  Wt Readings from Last 3 Encounters:  03/08/22 189 lb 6.4 oz (85.9 kg)  03/04/22 184 lb (83.5 kg)  09/20/20 191 lb 12.8 oz (87 kg)   General: Alert, oriented, no distress.  Skin: normal turgor, no rashes, warm and dry HEENT: Normocephalic, atraumatic. Pupils equal round and reactive to light; sclera anicteric; extraocular muscles intact; Fundi ** Nose without nasal septal  hypertrophy Mouth/Parynx benign; Mallinpatti scale 2 Neck: No JVD, no carotid bruits; normal carotid upstroke Lungs: clear to ausculatation and percussion; no wheezing or rales Chest wall: without tenderness to palpitation Heart: PMI not displaced, RRR,  s1 s2 normal, 1/6 systolic murmur, no diastolic murmur, no rubs, gallops, thrills, or heaves Abdomen: soft, nontender; no hepatosplenomehaly, BS+; abdominal aorta nontender and not dilated by palpation. Back: no CVA tenderness Pulses 2+ Musculoskeletal: full range of motion, normal strength, no joint deformities Extremities: no clubbing cyanosis or edema, Homan's sign negative  Neurologic: grossly nonfocal; Cranial nerves grossly wnl Psychologic: Normal mood and affect    Studies/Labs Reviewed:   June 22, 2023ECG (independently read by me): Sinus Bradycardia at 57; nonspecificc T wave abnormality   September 21 2020 ECG (independently read by me): Sinus bradycardia 58 bpm.  Nonspecific T wave abnormality.  Normal intervals.  No ectopy  November12, 2021 ECG (independently read by me): Normal sinus rhythm at 68 bpm.  No ectopy.  Normal intervals.  February 2020 ECG (independently read by me): NSR 61; nonspecific T wave abnormality  October 2019 ECG (independently read by me): Sinus bradycardia at 55 bpm.  Inferolateral T wave abnormality.  Preserved R waves.  Normal intervals.  No ectopy.  Recent Labs:    Latest Ref Rng & Units 07/23/2019    8:27 AM 10/24/2018    8:22 AM 03/17/2018    7:02 AM  BMP  Glucose 65 - 99 mg/dL 94  97  112   BUN 8 - 27 mg/dL '14  15  10   ' Creatinine 0.76 - 1.27 mg/dL 1.21  1.15  1.07   BUN/Creat Ratio 10 - '24 12  13    ' Sodium 134 - 144 mmol/L 140  139  139   Potassium 3.5 - 5.2 mmol/L 5.4  5.0  4.2   Chloride 96 - 106 mmol/L 101  99  105   CO2 20 - 29 mmol/L '25  23  25   ' Calcium 8.6 - 10.2 mg/dL 10.1  10.1  8.8         Latest Ref Rng & Units 07/23/2019    8:27 AM 10/24/2018    8:22 AM 05/30/2018    8:04 AM   Hepatic Function  Total Protein 6.0 - 8.5 g/dL 6.5  6.5  6.4   Albumin 3.8 - 4.8 g/dL 5.0  4.7  4.7   AST 0 - 40 IU/L '25  27  31   ' ALT 0 - 44 IU/L 38  53  57   Alk Phosphatase 39 - 117 IU/L 59  64  57   Total Bilirubin 0.0 - 1.2 mg/dL 0.7  0.9  0.7   Bilirubin, Direct 0.00 - 0.40 mg/dL   0.22        Latest Ref Rng & Units 10/24/2018    8:22 AM 03/18/2018    3:36 AM 03/17/2018    7:02 AM  CBC  WBC 3.4 - 10.8 x10E3/uL 5.6  8.6  11.1   Hemoglobin 13.0 - 17.7 g/dL 15.9  13.2  13.9   Hematocrit 37.5 - 51.0 % 45.8  39.2  40.9   Platelets 150 - 450 x10E3/uL 273  213  228    Lab Results  Component Value Date   MCV 93 10/24/2018   MCV 95.4 03/18/2018   MCV 93.4 03/17/2018   Lab Results  Component Value Date   TSH 1.460 10/24/2018   No results found for: "HGBA1C"   BNP No results found for: "BNP"  ProBNP No results found for: "PROBNP"   Lipid Panel     Component Value Date/Time   CHOL 132 07/23/2019 0827   TRIG 75 07/23/2019 0827   HDL 58 07/23/2019 0827  CHOLHDL 2.3 07/23/2019 0827   CHOLHDL 3.2 03/16/2018 1753   VLDL 13 03/16/2018 1753   LDLCALC 59 07/23/2019 0827     RADIOLOGY: US Venous Img Lower Unilateral Left  Result Date: 03/04/2022 CLINICAL DATA:  66 year old male with LEFT LOWER extremity pain and swelling. EXAM: LEFT LOWER EXTREMITY VENOUS DOPPLER ULTRASOUND TECHNIQUE: Gray-scale sonography with compression, as well as color and duplex ultrasound, were performed to evaluate the deep venous system(s) from the level of the common femoral vein through the popliteal and proximal calf veins. COMPARISON:  None Available. FINDINGS: VENOUS Normal compressibility of the LEFT common femoral, superficial femoral, and popliteal veins, as well as the visualized calf veins. Visualized portions of profunda femoral vein and great saphenous vein unremarkable. No filling defects to suggest DVT on grayscale or color Doppler imaging. Doppler waveforms show normal direction of  venous flow, normal respiratory plasticity and response to augmentation. Limited views of the RIGHT common femoral vein are unremarkable. OTHER A 2.6 x 2.8 x 0.9 cm popliteal cyst is noted. Limitations: none IMPRESSION: 1. No evidence of LEFT LOWER extremity DVT. 2. 2.8 cm LEFT popliteal cyst. Electronically Signed   By: Margarette Canada M.D.   On: 03/04/2022 12:33     Additional studies/ records that were reviewed today include:  I reviewed his hospitalization, catheterization report, laboratory and subsequent office evaluation. Recent laboratory was reviewed.  Prox RCA lesion is 30% stenosed. Prox RCA to Mid RCA lesion is 100% stenosed. Prox LAD lesion is 20% stenosed. Mid LAD to Dist LAD lesion is 5% stenosed. Post Atrio-1 lesion is 20% stenosed. Post Atrio-2 lesion is 20% stenosed. Post intervention, there is a 5% residual stenosis. A stent was successfully placed.   Acute coronary syndrome secondary to total occlusion of the mid RCA with faint left to right collaterals.   There is evidence for mild coronary calcification.  The LAD has 20% proximal narrowing.  The mid LAD has mid systolic bridging with narrowing up to at least 50% during systole; a small ramus intermediate vessel is normal; the circumflex vessel is normal.   RCA is a dominant vessel that a 30% proximal stenosis and was totally occluded in its mid segment proximal to the takeoff of an acute marginal branch.  Once initial flow was established there was significant spasm in the mid distal RCA which ultimately improved with TIMI-3 flow following 100 mcg of IC nitroglycerin and successful revascularization. There is mild 20% PLA stenoses segmentally.   Successful PCI to the totally occluded mid RCA with ultimate insertion of a 2.5 x 15 mm Resolute Onyx stent postdilated to 2.78 mm with the 100% occlusion being reduced to less than 5% and with initial TIMI 0 flow being improved to TIMI-3 flow.   RECOMMENDATION: DAPT for minimum of  1 year.  High potency statin therapy will be started.  With the patient's  low blood pressure his daily lisinopril 20 mg dose will be reduced initially to 2.5 mg.  Ultimately plan to add possibly nitrates or amlodipine with muscle bridging.  The patient is bradycardic which may limit beta-blocker treatment.  We will plan echo Doppler in a.m. to assess LV function.  Continue postprocedure hydration.   Intervention    ASSESSMENT:    1. Coronary artery disease involving native coronary artery of native heart without angina pectoris   2. NSTEMI (non-ST elevated myocardial infarction) [facility]): March 16, 2018 -DES stent to mid RCA   3. Hyperlipidemia with target LDL less than 70   4.  Essential hypertension   5. Pain of left calf     PLAN:  Mr. Keston Seever is a very healthy-appearing 66 year old gentleman who suffered an acute coronary syndrome on March 16, 2018 secondary to total occlusion of the mid RCA with faint left-to-right collaterals.  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  He underwent successful intervention with ultimate insertion of a 2.5 x 15 mm Resolute Onyx DES stent postdilated to 2.78 mm with the 100% occlusion being reduced to less than 5% and with resolution of TIMI-3 flow.  His echo Doppler study done the following day showed normalization of LV function without wall motion abnormalities.  Since his acute coronary syndrome, he has continued to remain cardiovascularly stable.  He has been without anginal symptomatology and continues to exercise regularly with significant cycling.  He denies any exertional chest pain or exertional shortness of breath.  He had continue to be on aspirin and Brilinta for several years and last year was changed to aspirin/Plavix.  He admits to some mild bruisability.  I reviewed recent laboratory that was done in [place] from January 2023.  LDL cholesterol has improved but is still in excess of 70 at 76.   He was wondering about possibly getting off Plavix.  He remains an excellent physical condition and continues to undergo strenuous biking.  He suffered a myocardial infarction in 2019 and I suspect his culprit lesion was an initial vulnerable plaque of not high-grade stenosis.  For this reason I have recommended reassessment of lipid studies with an LP(a) as well as chemistry to be done in the fasting state next week.  If his LP(a) is elevated I have recommended potential more aggressive lipid management and attempt to reduce LDL into the 50s or below.  I also discussed alternative therapy with discontinuing aspirin and continuing Plavix rather than discontinuing Plavix for slightly improved antiplatelet benefit with reduction in bruisability.  I will contact him regarding the results of his laboratory.  He will be following up with his primary [facility] at [facility].  I will see him in 1 year for reevaluation.   Medication Adjustments/Labs and Tests Ordered: Current medicines are reviewed at length with the patient today.  Concerns regarding medicines are outlined above.  Medication changes, Labs and Tests ordered today are listed in the Patient Instructions below. Patient Instructions   Follow-Up: At [facility], you and your health needs are our priority.  As part of our continuing mission to provide you with exceptional heart care, we have created designated Provider Care Teams.  These Care Teams include your primary Cardiologist (physician) and Advanced Practice Providers (APPs -  Physician Assistants and Nurse Practitioners) who all work together to provide you with the care you need, when you need it.  We recommend signing up for the patient portal called "MyChart".  Sign up information is provided on this After Visit Summary.  MyChart is used to connect with patients for Virtual Visits (Telemedicine).  Patients are able to view lab/test results, encounter notes, upcoming appointments, etc.   Non-urgent messages can be sent to your provider as well.   To learn more about what you can do with MyChart, go to [email or web address].    Your next appointment:   12 month(s)  The format for your next appointment:   In Person  Provider:   Shelva Majestic, MD       Important Information About Sugar  Signed, Shelva Majestic, MD  03/08/2022 6:16 PM    [facility] [address] [place], [place], [place]  [address] Phone: [phone]

## 2022-03-19 ENCOUNTER — Other Ambulatory Visit: Payer: Self-pay | Admitting: Cardiovascular Disease

## 2022-05-18 ENCOUNTER — Other Ambulatory Visit: Payer: Self-pay | Admitting: Cardiovascular Disease

## 2022-06-17 ENCOUNTER — Other Ambulatory Visit: Payer: Self-pay | Admitting: Cardiovascular Disease

## 2022-08-16 ENCOUNTER — Other Ambulatory Visit: Payer: Self-pay | Admitting: Cardiovascular Disease

## 2022-10-09 ENCOUNTER — Encounter: Payer: Self-pay | Admitting: Cardiovascular Disease

## 2022-10-18 ENCOUNTER — Encounter: Payer: Self-pay | Admitting: Cardiovascular Disease

## 2022-10-18 NOTE — Telephone Encounter (Signed)
Spoke with patient regarding recent lab work from [facility]. He stated he will upload the results to [facility] today or tomorrow.

## 2023-01-08 ENCOUNTER — Encounter: Payer: Self-pay | Admitting: Cardiovascular Disease

## 2023-02-21 IMAGING — US US EXTREM LOW VENOUS*L*
1 series · 14 of 24 positions shown · non-contrast
Comparison: None Available.

CLINICAL DATA: 65-year-old male with LEFT LOWER extremity pain and
swelling.

EXAM:
LEFT LOWER EXTREMITY VENOUS DOPPLER ULTRASOUND
TECHNIQUE: Gray-scale sonography with compression, as well as color and duplex
ultrasound, were performed to evaluate the deep venous system(s)
from the level of the common femoral vein through the popliteal and
proximal calf veins.

[Series 1: us extrem low venous*left* · 35 acquisitions, 14 frames shown]
[im 1/35]
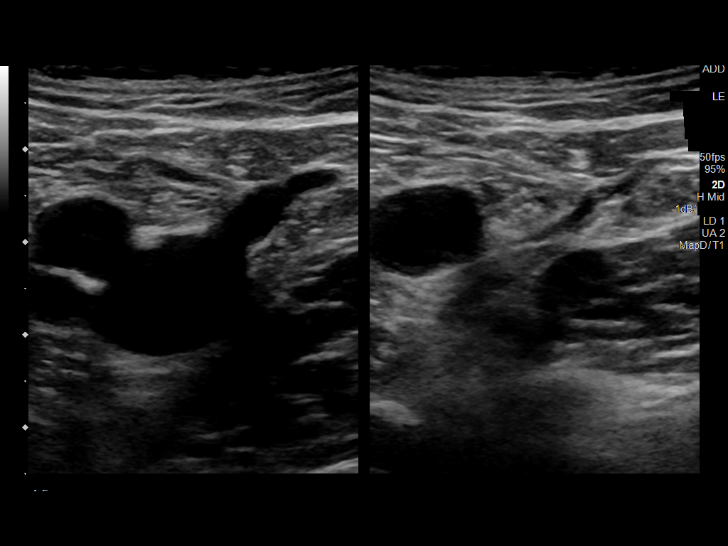
[im 3/35]
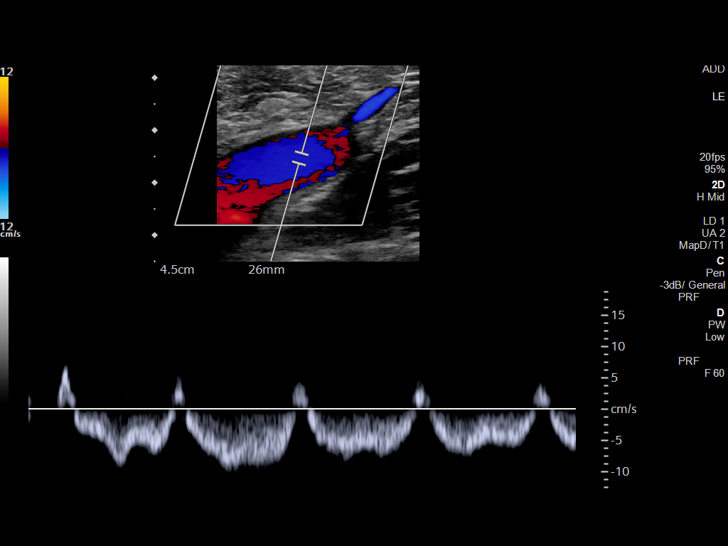
[im 6/35]
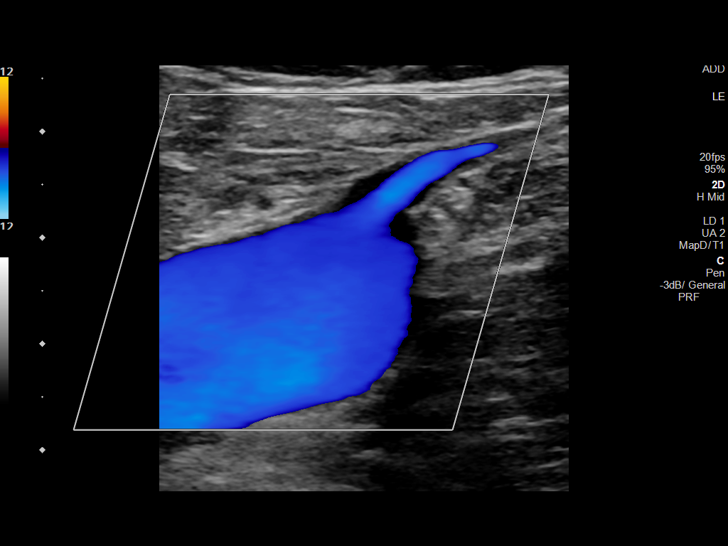
[im 9/35]
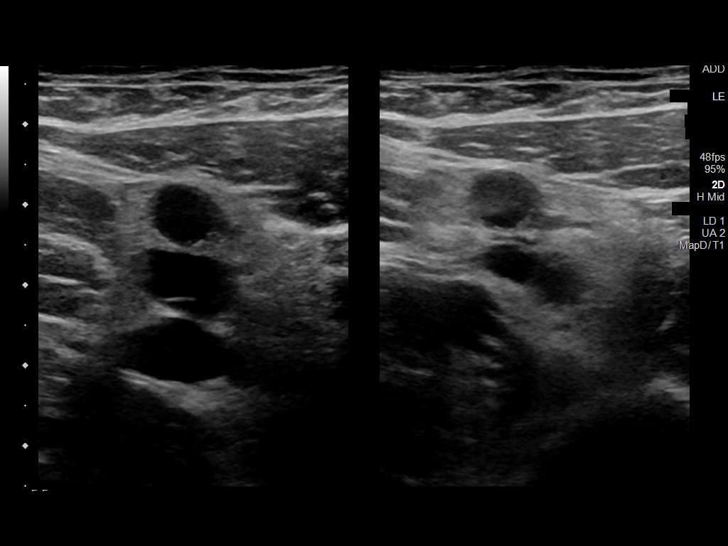
[im 11/35]
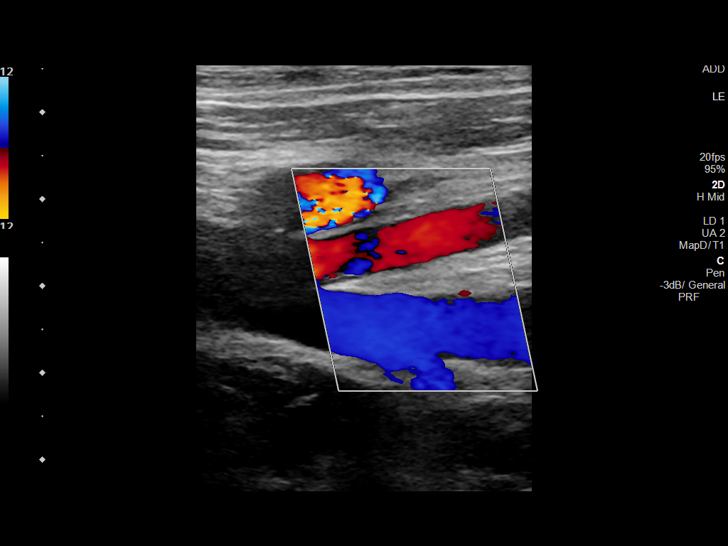
[im 14/35]
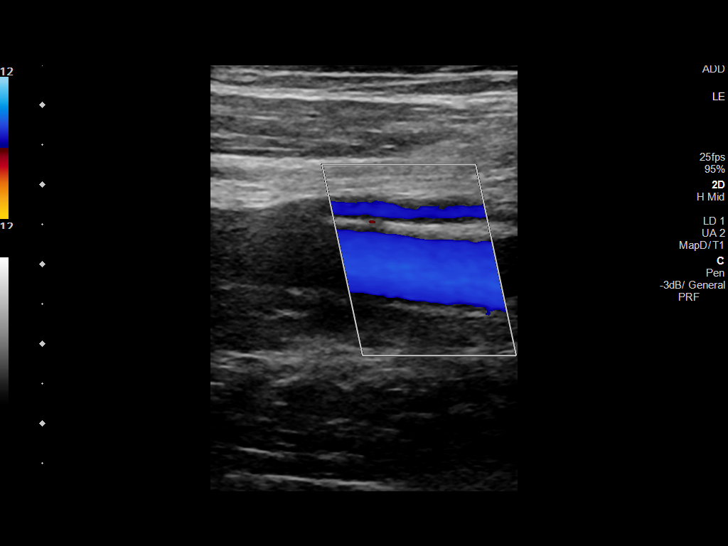
[im 17/35]
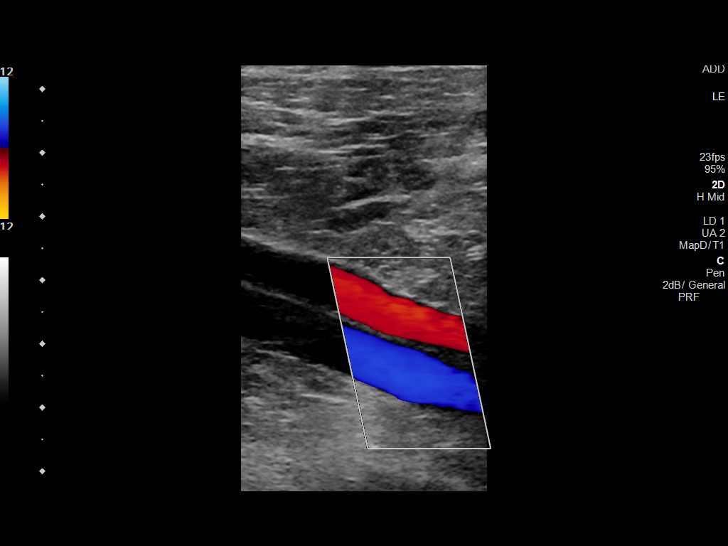
[im 20/35]
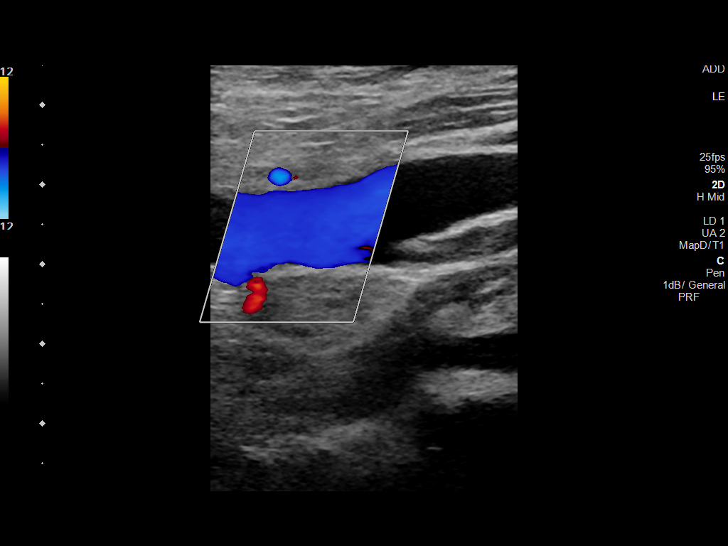
[im 23/35]
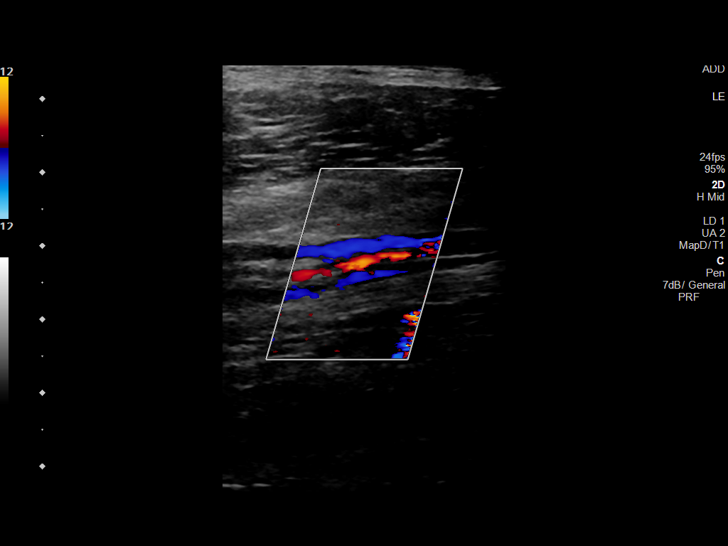
[im 26/35]
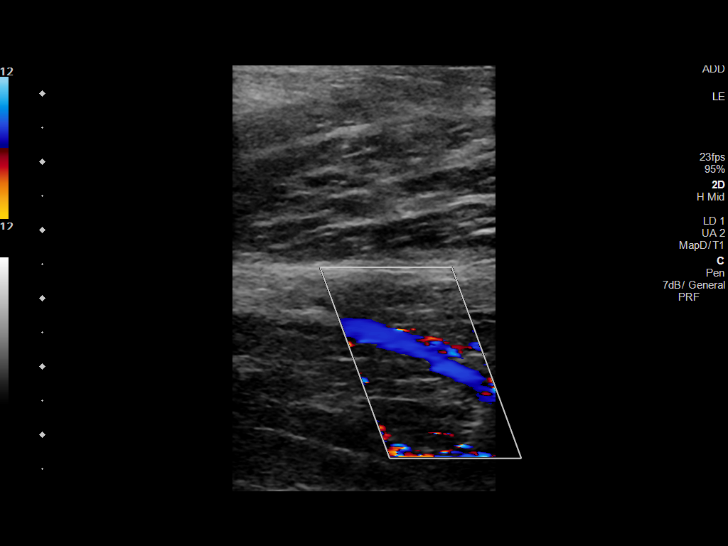
[im 29/35]
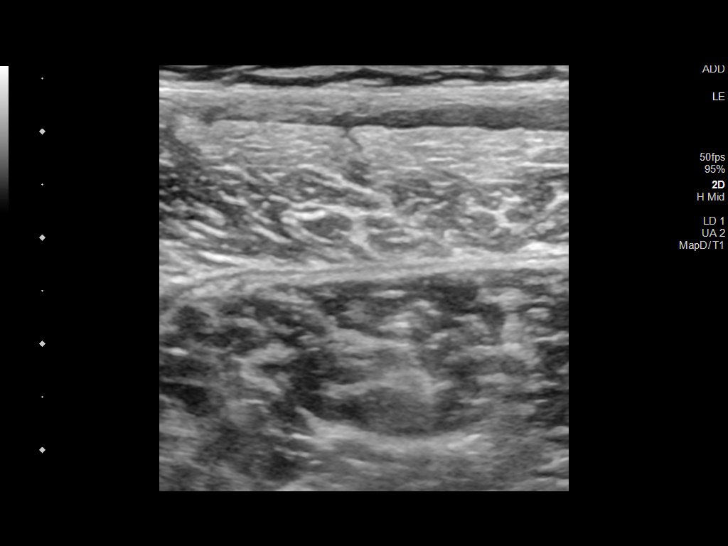
[im 30/35]
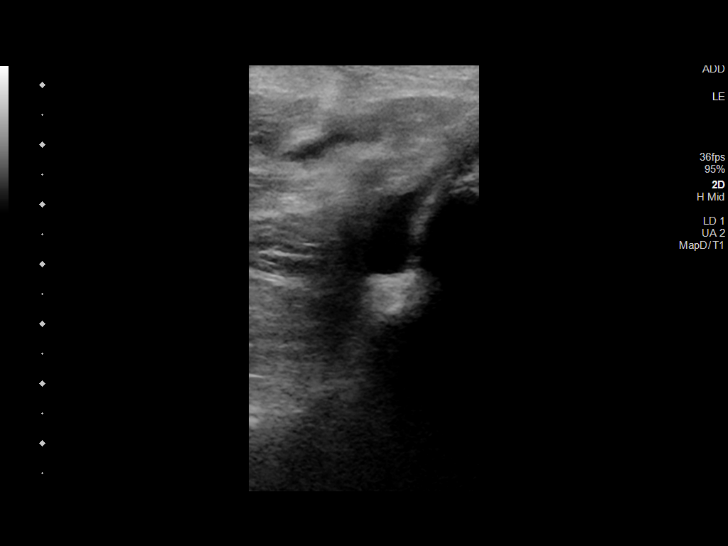
[im 32/35]
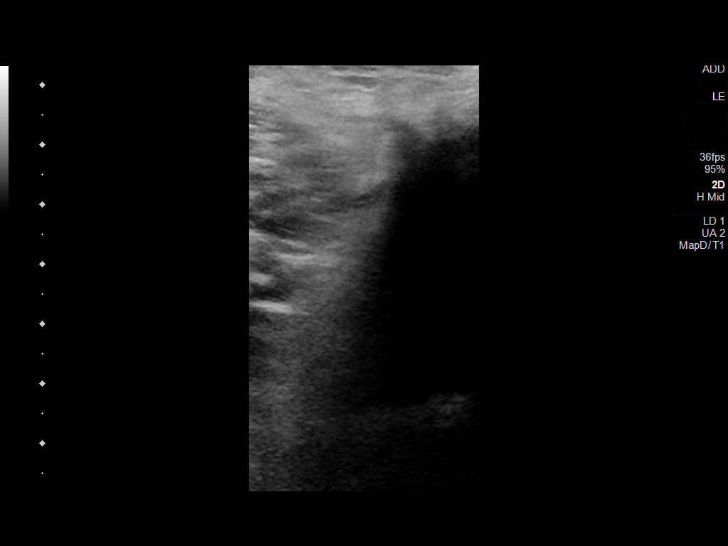
[im 35/35]
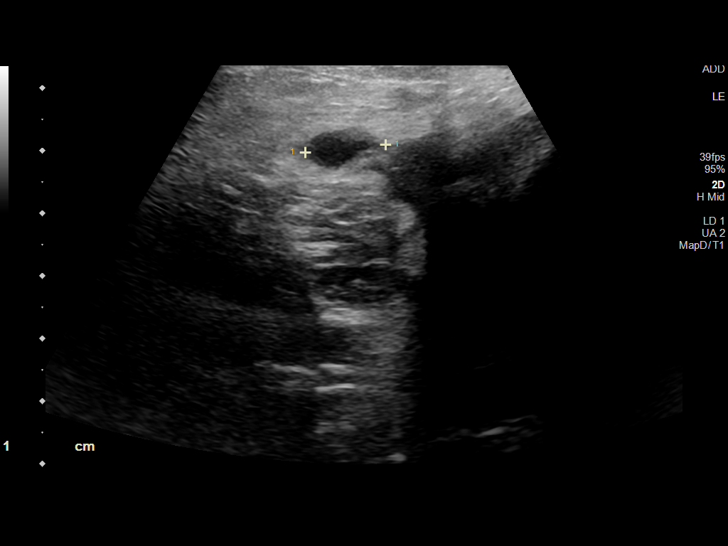

[14 of 24 positions shown; findings below may reference images not displayed]

FINDINGS: VENOUS

Normal compressibility of the LEFT common femoral, superficial
femoral, and popliteal veins, as well as the visualized calf veins.
Visualized portions of profunda femoral vein and great saphenous
vein unremarkable. No filling defects to suggest DVT on grayscale or
color Doppler imaging. Doppler waveforms show normal direction of
venous flow, normal respiratory plasticity and response to
augmentation.

Limited views of the RIGHT common femoral vein are unremarkable.

OTHER

A 2.6 x 2.8 x 0.9 cm popliteal cyst is noted.

Limitations: none
IMPRESSION: 1. No evidence of LEFT LOWER extremity DVT.
2. 2.8 cm LEFT popliteal cyst.

## 2023-03-17 ENCOUNTER — Other Ambulatory Visit: Payer: Self-pay | Admitting: Cardiovascular Disease

## 2023-05-23 ENCOUNTER — Telehealth: Payer: Self-pay | Admitting: *Deleted

## 2023-05-23 NOTE — Telephone Encounter (Signed)
   Pre-operative Risk Assessment    Patient Name: Elijah Baker  DOB: May 12, 1956 MRN: [id number]    DATE OF LAST VISIT: 03/08/22 DR. KELLY DATE OF NEXT VISIT: NONE  Request for Surgical Clearance    Procedure:   COLONOSCOPY  Date of Surgery:  Clearance 07/18/23                                 Surgeon: NOT LISTED Surgeon's Group or Practice Name:  [facility] Phone number:  [phone] Fax number:  [phone]   Type of Clearance Requested:   - Medical ; BRILINTA x 4 DAYS PRIOR   Type of Anesthesia:  Not Indicated (PROPOFOL?)   Additional requests/questions:    Elpidio Anis   05/23/2023, 10:12 AM

## 2023-05-23 NOTE — Telephone Encounter (Signed)
   Name: Elijah Baker  DOB: 11/24/1955  MRN: [id number]  Primary Cardiologist: Nicki Guadalajara, MD  Chart reviewed as part of pre-operative protocol coverage. Because of Elijah Baker's past medical history and time since last visit, he will require a follow-up in-office visit in order to better assess preoperative cardiovascular risk.  Pre-op covering staff: - Please schedule appointment and call patient to inform them. If patient already had an upcoming appointment within acceptable timeframe, please add "pre-op clearance" to the appointment notes so provider is aware. - Please contact requesting surgeon's office via preferred method (i.e, phone, fax) to inform them of need for appointment prior to surgery.  Per office protocol, if patient is without any new symptoms or concerns at the time of their virtual visit, he/she may hold ASA for 7 days and Plavix for 5 days prior to procedure. Please resume ASA and Plavix as soon as possible postprocedure, at the discretion of the surgeon.   Per clearance request, GI requesting recommendations on Brilinta. Per my chart review, patient was previously switched from Brilinta to Plavix by Dr. Tresa Endo and per his last OV note from June 2023, Plavix and ASA were continued. Please confirm patient's current anti-platelet regimen at follow up.    Perlie Gold, PA-C  05/23/2023, 10:28 AM

## 2023-05-24 NOTE — Telephone Encounter (Signed)
1st attempt to reach pt to schedule IN OFFICE visit for clearance. Lvm

## 2023-05-27 NOTE — Telephone Encounter (Signed)
Patient is returning call. Please advise? 

## 2023-05-27 NOTE — Telephone Encounter (Signed)
I s/w the pt and he has been scheduled for appt in office with Joni Reining, DNP for pre op clearance. 06/06/23 @ 1:55 Joni Reining, DNP. I will update all parties involved.   Pt thanked me for the help.

## 2023-05-27 NOTE — Telephone Encounter (Signed)
2nd attempt to reach pt who needs in office appt for pre op clearance.

## 2023-06-03 NOTE — Progress Notes (Unsigned)
Cardiology Clinic Note   Patient Name: Elijah Baker Date of Encounter: 06/06/2023  Primary Care Provider:  Medicine, [facility] Cardiologist:  Elijah Guadalajara, MD  Patient Profile    67 year old male with hx of HTN,CAD 2019 cardiac catheterization by Dr. Tresa Baker where he was found to have an acute coronary system secondary to total occlusion of the mid RCA with very faint left to right collaterals. There was mild coronary calcification. The LAD had 20% proximal narrowing. There was midsystolic bridging of the mid LAD with narrowing up to at least 50% during systole.   He had a normal ramus intermediate and circumflex vessel. His RCA had 30% proximal stenosis and was totally occluded in its mid segment proximal to the takeoff of an acute marginal branch. Once initial flow was established there was significant spasm in the mid distal RCA which ultimately improved to TIMI-3 flow following 100 mcg of intracoronary nitroglycerin and successful revascularization wit DES to [facility],   He is very athletic and rides a bicycle 5-7 days a week.  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   Past Medical History    Past Medical History:  Diagnosis Date   Hyperlipidemia    Hypertension    NSTEMI (non-ST elevated myocardial infarction) (HCC)    03/16/18 PCI/DES to mRCA, normal EF   Past Surgical History:  Procedure Laterality Date   CORONARY/GRAFT ACUTE MI REVASCULARIZATION N/A 03/16/2018   Procedure: Coronary/Graft Acute MI Revascularization;  Surgeon: Elijah Bihari, MD;  Location: [facility] INVASIVE CV LAB;  Service: Cardiovascular;  Laterality: N/A;   LEFT HEART CATH AND CORONARY ANGIOGRAPHY N/A 03/16/2018   Procedure: LEFT HEART CATH AND CORONARY ANGIOGRAPHY;  Surgeon: Elijah Bihari, MD;  Location: [facility] INVASIVE CV LAB;  Service: Cardiovascular;  Laterality: N/A;     Allergies  Allergies  Allergen Reactions   Azithromycin    Bee Venom    Ciprofloxacin    Metronidazole     History of Present Illness    Elijah Baker comes today without any cardiac complaints.  He is here for preoperative evaluation in order to have colonoscopy by [facility] through [place].  This is scheduled for July 18, 2023.   He is also requesting to stop antiplatelet therapy as he is 5 years post PCI.  He remains very active cycling up to 100 miles a week offroad and on road.  He denies any chest pain dyspnea on exertion fatigue palpitations or dizziness.  He is medically compliant.  He has had recent labs through primary care.  Home Medications    Current Outpatient Medications  Medication Sig Dispense Refill   aspirin 81 MG chewable tablet Chew 1 tablet (81 mg total) by mouth daily.     ketoconazole (NIZORAL) 2 % shampoo Apply 1 Application topically 2 (two) times a week.     Lactobacillus (DIGESTIVE HEALTH PROBIOTIC PO) Take 1 tablet by mouth daily.     LYCOPENE PO Take 1 tablet by mouth daily.     nitroGLYCERIN (NITROSTAT) 0.4 MG SL tablet Place 1 tablet (0.4 mg total) under the tongue every 5 (five) minutes x 3 doses as needed for chest pain. 25 tablet 0   amLODipine (NORVASC) 2.5 MG tablet Take 1 tablet (2.5 mg total) by mouth daily. 90 tablet 3   atorvastatin (LIPITOR) 80 MG tablet Take 1 tablet (80 mg total) by mouth  daily. 90 tablet 3   lisinopril (ZESTRIL) 2.5 MG tablet Take 1 tablet (2.5 mg total) by mouth daily. 90 tablet 3   No current facility-administered medications for this visit.     Family History    Family History  Problem Relation Age of Onset   Parkinson's disease Father    He indicated that his mother is alive. He indicated that his father is alive.  Social History    Social History   Socioeconomic History   Marital status: Married    Spouse name: Not on file   Number of children: Not on file   Years of education:  Not on file   Highest education level: Not on file  Occupational History   Not on file  Tobacco Use   Smoking status: Never   Smokeless tobacco: Never  Vaping Use   Vaping status: Never Used  Substance and Sexual Activity   Alcohol use: Yes    Comment: 2 beers a day   Drug use: Never   Sexual activity: Yes  Other Topics Concern   Not on file  Social History Narrative   Not on file   Social Determinants of Health   Financial Resource Strain: Low Risk  (09/01/2021)   Received from [facility], [facility]   Overall Financial Resource Strain (CARDIA)    Difficulty of Paying Living Expenses: Not hard at all  Food Insecurity: No Food Insecurity (10/02/2021)   Received from [facility], [facility]   Hunger Vital Sign    Worried About Running Out of Food in the Last Year: Never true    Ran Out of Food in the Last Year: Never true  Transportation Needs: No Transportation Needs (09/01/2021)   Received from [facility], [facility]   PRAPARE - Transportation    Lack of Transportation (Medical): No    Lack of Transportation (Non-Medical): No  Physical Activity: Sufficiently Active (09/01/2021)   Received from [facility], [facility]   Exercise Vital Sign    Days of Exercise per Week: 4 days    Minutes of Exercise per Session: 90 min  Stress: Stress Concern Present (09/01/2021)   Received from [facility], [facility] of Occupational Health - Occupational Stress Questionnaire    Feeling of Stress : To some extent  Social Connections: Socially Integrated (10/07/2022)   Received from [facility], [facility]   Social Network    How would you rate your social network (family, work, friends)?: Good participation with social networks  Intimate Partner Violence: Not At Risk (10/07/2022)   Received from [facility], [facility]   HITS    Over the last 12 months how often did your partner physically hurt you?: 1    Over the last 12 months  how often did your partner insult you or talk down to you?: 1    Over the last 12 months how often did your partner threaten you with physical harm?: 1    Over the last 12 months how often did your partner scream or curse at you?: 1     Review of Systems    General:  No chills, fever, night sweats or weight changes.  Cardiovascular:  No chest pain, dyspnea on exertion, edema, orthopnea, palpitations, paroxysmal nocturnal dyspnea. Dermatological: No rash, lesions/masses Respiratory: No cough, dyspnea Urologic: No hematuria, dysuria Abdominal:   No nausea, vomiting, diarrhea, bright red blood per rectum, melena, or hematemesis Neurologic:  No visual changes, wkns, changes in mental  status. All other systems reviewed and are otherwise negative except as noted above.  EKG Interpretation Date/Time:  Thursday June 06 2023 13:56:42 EDT Ventricular Rate:  60 PR Interval:  234 QRS Duration:  92 QT Interval:  414 QTC Calculation: 414 R Axis:   38  Text Interpretation: Sinus rhythm with 1st degree A-V block Nonspecific T wave abnormality When compared with ECG of 17-Mar-2018 07:03, PR interval has increased ST no longer elevated in Inferior leads Confirmed by Joni Reining [phone]) on 06/06/2023 4:24:30 PM    Physical Exam    VS:  BP 118/84 (BP Location: Left Arm, Patient Position: Sitting, Cuff Size: Normal)   Pulse 60   Ht 6\' 2"  (1.88 m)   Wt 191 lb 9.6 oz (86.9 kg)   SpO2 96%   BMI 24.60 kg/m  , BMI Body mass index is 24.6 kg/m.     GEN: Well nourished, well developed, in no acute distress. HEENT: normal. Neck: Supple, no JVD, carotid bruits, or masses. Cardiac: RRR, no murmurs, rubs, or gallops. No clubbing, cyanosis, edema.  Radials/DP/PT 2+ and equal bilaterally.  Respiratory:  Respirations regular and unlabored, clear to auscultation bilaterally. GI: Soft, nontender, nondistended, BS + x 4. MS: no deformity or atrophy. Skin: warm and dry, no rash. Neuro:  Strength and  sensation are intact. Psych: Normal affect.  EKG Interpretation Date/Time:  Thursday June 06 2023 13:56:42 EDT Ventricular Rate:  60 PR Interval:  234 QRS Duration:  92 QT Interval:  414 QTC Calculation: 414 R Axis:   38  Text Interpretation: Sinus rhythm with 1st degree A-V block Nonspecific T wave abnormality When compared with ECG of 17-Mar-2018 07:03, PR interval has increased ST no longer elevated in Inferior leads Confirmed by Joni Reining [phone]) on 06/06/2023 4:24:30 PM   Lab Results  Component Value Date   WBC 5.6 10/24/2018   HGB 15.9 10/24/2018   HCT 45.8 10/24/2018   MCV 93 10/24/2018   PLT 273 10/24/2018   Lab Results  Component Value Date   CREATININE 1.21 07/23/2019   BUN 14 07/23/2019   NA 140 07/23/2019   K 5.4 (H) 07/23/2019   CL 101 07/23/2019   CO2 25 07/23/2019   Lab Results  Component Value Date   ALT 38 07/23/2019   AST 25 07/23/2019   ALKPHOS 59 07/23/2019   BILITOT 0.7 07/23/2019   Lab Results  Component Value Date   CHOL 132 07/23/2019   HDL 58 07/23/2019   LDLCALC 59 07/23/2019   TRIG 75 07/23/2019   CHOLHDL 2.3 07/23/2019    No results found for: "HGBA1C"   Review of Prior Studies LHC 2019 Prox RCA lesion is 30% stenosed. Prox RCA to Mid RCA lesion is 100% stenosed. Prox LAD lesion is 20% stenosed. Mid LAD to Dist LAD lesion is 5% stenosed. Post Atrio-1 lesion is 20% stenosed. Post Atrio-2 lesion is 20% stenosed. Post intervention, there is a 5% residual stenosis. A stent was successfully placed.   Acute coronary syndrome secondary to total occlusion of the mid RCA with faint left to right collaterals.   There is evidence for mild coronary calcification.  The LAD has 20% proximal narrowing.  The mid LAD has mid systolic bridging with narrowing up to at least 50% during systole; a small ramus intermediate vessel is normal; the circumflex vessel is normal.   RCA is a dominant vessel that a 30% proximal stenosis and was  totally occluded in its mid segment proximal to the takeoff of  an acute marginal branch.  Once initial flow was established there was significant spasm in the mid distal RCA which ultimately improved with TIMI-3 flow following 100 mcg of IC nitroglycerin and successful revascularization. There is mild 20% PLA stenoses segmentally.   Successful PCI to the totally occluded mid RCA with ultimate insertion of a 2.5 x 15 mm Resolute Onyx stent postdilated to 2.78 mm with the 100% occlusion being reduced to less than 5% and with initial TIMI 0 flow being improved to TIMI-3 flow.   RECOMMENDATION: DAPT for minimum of 1 year.  High potency statin therapy will be started.  With the patient's  low blood pressure his daily lisinopril 20 mg dose will be reduced initially to 2.5 mg.  Ultimately plan to add possibly nitrates or amlodipine with muscle bridging.  The patient is bradycardic which may limit beta-blocker treatment.  We will plan echo Doppler in a.m. to assess LV function.  Continue postprocedure hydration.    Intervention     Assessment & Plan   1.  Preoperative cardiac evaluation:  According to the Revised Cardiac Risk Index (RCRI), his Perioperative Risk of Major Cardiac Event is (%): 0.4  His Functional Capacity in METs is: 9.89 according to the Duke Activity Status Index (DASI).   Therefore, based on ACC/AHA guidelines, patient would be at acceptable risk for the planned procedure without further cardiovascular testing. I will route this recommendation to the requesting party via Epic fax function.  We are discontinuing Brilinta per ACC guidelines, as he is 5 years post PCI completely asymptomatic and physically active.  2.  Coronary artery disease: Status post PCI to the right coronary artery in 2009, the patient had the patient had LAD with 20% proximal narrowing, midsystolic bridging from the mid LAD with narrowing to at least 50% during systole.  Per ACC guidelines I will discontinue  Brilinta.  As he has had his procedure 5 years ago.  The patient will continue aspirin 81 mg daily, secondary prevention with blood pressure control, lipid management, and continued exercise.  3.  Hypercholesterolemia: He has had recent labs completed in January 2024 by primary care provider, LDL was 80, total cholesterol 162, HDL 64.  He is due to have labs drawn November this year through PCP.  We would like to have a copy sent to [place].  Goal of LDL should be less than 70 in a patient with known CAD.  3.  Hypertension: Excellent control of blood pressure on lisinopril 2.5 mg daily and amlodipine 2.5 mg daily.  May consider discontinuing amlodipine on follow-up visit if blood pressure level causes him to have some dizziness or fatigue.  He does not appear to be having any of the symptoms currently.  He remains very athletic and has high energy.       Signed, Bettey Mare. Liborio Nixon, ANP, AACC   06/06/2023 4:26 PM      Office [phone] Fax [phone]  Notice: This dictation was prepared with Dragon dictation along with smaller phrase technology. Any transcriptional errors that result from this process are unintentional and may not be corrected upon review.

## 2023-06-06 ENCOUNTER — Encounter: Payer: Self-pay | Admitting: Adult Health

## 2023-06-06 ENCOUNTER — Ambulatory Visit: Payer: 59 | Attending: Adult Health | Admitting: Adult Health

## 2023-06-06 VITALS — BP 118/84 | HR 60 | Ht 74.0 in | Wt 191.6 lb

## 2023-06-06 DIAGNOSIS — I214 Non-ST elevation (NSTEMI) myocardial infarction: Secondary | ICD-10-CM | POA: Diagnosis not present

## 2023-06-06 DIAGNOSIS — E7800 Pure hypercholesterolemia, unspecified: Secondary | ICD-10-CM | POA: Diagnosis not present

## 2023-06-06 DIAGNOSIS — I10 Essential (primary) hypertension: Secondary | ICD-10-CM | POA: Diagnosis not present

## 2023-06-06 DIAGNOSIS — Z01818 Encounter for other preprocedural examination: Secondary | ICD-10-CM | POA: Diagnosis not present

## 2023-06-06 MED ORDER — AMLODIPINE BESYLATE 2.5 MG PO TABS: 2.5000 mg | ORAL_TABLET | Freq: Every day | ORAL | 3 refills | Status: DC

## 2023-06-06 MED ORDER — LISINOPRIL 2.5 MG PO TABS: 2.5000 mg | ORAL_TABLET | Freq: Every day | ORAL | 3 refills | Status: DC

## 2023-06-06 MED ORDER — ATORVASTATIN CALCIUM 80 MG PO TABS: 80.0000 mg | ORAL_TABLET | Freq: Every day | ORAL | 3 refills | Status: DC

## 2023-06-06 NOTE — Patient Instructions (Signed)
Medication Instructions:  Stop Brilinta  *If you need a refill on your cardiac medications before your next appointment, please call your pharmacy*   Lab Work: No labs If you have labs (blood work) drawn today and your tests are completely normal, you will receive your results only by: MyChart Message (if you have MyChart) OR A paper copy in the mail If you have any lab test that is abnormal or we need to change your treatment, we will call you to review the results.   Testing/Procedures: No Testing   Follow-Up: At [facility], you and your health needs are our priority.  As part of our continuing mission to provide you with exceptional heart care, we have created designated Provider Care Teams.  These Care Teams include your primary Cardiologist (physician) and Advanced Practice Providers (APPs -  Physician Assistants and Nurse Practitioners) who all work together to provide you with the care you need, when you need it.  We recommend signing up for the patient portal called "MyChart".  Sign up information is provided on this After Visit Summary.  MyChart is used to connect with patients for Virtual Visits (Telemedicine).  Patients are able to view lab/test results, encounter notes, upcoming appointments, etc.  Non-urgent messages can be sent to your provider as well.   To learn more about what you can do with MyChart, go to [email or web address].    Your next appointment:   Keep Scheduled Appointment  Provider:   Nicki Guadalajara, MD

## 2023-08-15 ENCOUNTER — Other Ambulatory Visit: Payer: Self-pay | Admitting: Cardiovascular Disease

## 2023-08-24 ENCOUNTER — Encounter: Payer: Self-pay | Admitting: Cardiovascular Disease

## 2023-08-26 MED ORDER — AMLODIPINE BESYLATE 2.5 MG PO TABS: 2.5000 mg | ORAL_TABLET | Freq: Every day | ORAL | 3 refills | Status: DC

## 2023-11-08 ENCOUNTER — Encounter: Payer: Self-pay | Admitting: Cardiovascular Disease

## 2023-11-08 ENCOUNTER — Ambulatory Visit: Payer: Medicare Other | Attending: Cardiovascular Disease | Admitting: Cardiovascular Disease

## 2023-11-08 VITALS — BP 138/88 | HR 63 | Ht 73.0 in | Wt 196.4 lb

## 2023-11-08 DIAGNOSIS — I214 Non-ST elevation (NSTEMI) myocardial infarction: Secondary | ICD-10-CM | POA: Diagnosis not present

## 2023-11-08 DIAGNOSIS — I2510 Atherosclerotic heart disease of native coronary artery without angina pectoris: Secondary | ICD-10-CM

## 2023-11-08 DIAGNOSIS — I10 Essential (primary) hypertension: Secondary | ICD-10-CM

## 2023-11-08 DIAGNOSIS — E785 Hyperlipidemia, unspecified: Secondary | ICD-10-CM | POA: Diagnosis present

## 2023-11-08 DIAGNOSIS — Z79899 Other long term (current) drug therapy: Secondary | ICD-10-CM

## 2023-11-08 DIAGNOSIS — E7800 Pure hypercholesterolemia, unspecified: Secondary | ICD-10-CM

## 2023-11-08 MED ORDER — AMLODIPINE BESYLATE 5 MG PO TABS: 5.0000 mg | ORAL_TABLET | Freq: Every day | ORAL | 3 refills | Status: DC

## 2023-11-08 NOTE — Patient Instructions (Signed)
Medication Instructions:  Increase the Amlodipine from 2.5mg  to 5mg  daily.  Take Lisinopril 5mg  daily.   *If you need a refill on your cardiac medications before your next appointment, please call your pharmacy*   Lab Work: No labs were ordered during today's visit.    If you have labs (blood work) drawn today and your tests are completely normal, you will receive your results only by: MyChart Message (if you have MyChart) OR A paper copy in the mail If you have any lab test that is abnormal or we need to change your treatment, we will call you to review the results.   Testing/Procedures: Your physician has requested that you have en exercise stress myoview. For further information please visit [email or web address] Please follow instruction sheet, as given.    Follow-Up: At [facility], you and your health needs are our priority.  As part of our continuing mission to provide you with exceptional heart care, we have created designated Provider Care Teams.  These Care Teams include your primary Cardiologist (physician) and Advanced Practice Providers (APPs -  Physician Assistants and Nurse Practitioners) who all work together to provide you with the care you need, when you need it.  We recommend signing up for the patient portal called "MyChart".  Sign up information is provided on this After Visit Summary.  MyChart is used to connect with patients for Virtual Visits (Telemedicine).  Patients are able to view lab/test results, encounter notes, upcoming appointments, etc.  Non-urgent messages can be sent to your provider as well.   To learn more about what you can do with MyChart, go to [email or web address].    Your next appointment:   3 month(s)  Provider:   Nicki Guadalajara, MD     Other Instructions        1st Floor: - Lobby - Registration  - Pharmacy  - Lab - Cafe   2nd Floor: - PV Lab - Diagnostic Testing (echo, CT, nuclear med)   3rd Floor: - Vacant    4th Floor: - TCTS (cardiothoracic surgery) - AFib Clinic - Structural Heart Clinic - Vascular Surgery  - Vascular Ultrasound   5th Floor: - HeartCare Cardiology (general and EP) - Clinical Pharmacy for coumadin, hypertension, lipid, weight-loss medications, and med management appointments      Valet parking services will be available as well.

## 2023-11-08 NOTE — Progress Notes (Signed)
Cardiology Office Note    Date:  11/08/2023   ID:  Elijah Baker, Elijah Baker [birth year], MRN [id number]  PCP:  Medicine, [facility]  Cardiologist:  Elijah Guadalajara, MD   No chief complaint on file.  20 month  F/U office visit   History of Present Illness:  Elijah Baker is a 68 y.o. male who presents to the office for a 20 month follow-up cardiology evaluation.    Elijah Baker has a history of hypertension and remotely had been on cholesterol medication which was discontinued approximately 5 years prior to his initial evaluation with me.  He is an avid cyclist and exercises regularly.  On the morning of March 16, 2018 he developed new onset chest pressure.  His chest pressure wax and wane throughout the entire day and he ultimately presented to the hospital for persistent comfort in the evening.  He was found to have initial inferolateral T wave inversion and ongoing chest pain requiring increasing doses of nitroglycerin.  Initial troponin was positive at 4.26.  As result I took him urgently to the cardiac catheterization laboratory where he was found to have an acute coronary system  secondary to total occlusion of the mid RCA with very faint left to right collaterals.  There was mild coronary calcification.  The LAD had 20% proximal narrowing.  There was midsystolic bridging of the mid LAD with narrowing up to at least 50% during systole.  He had a normal ramus intermediate and circumflex vessel.  His RCA had 30% proximal stenosis and was totally occluded in its mid segment proximal to the takeoff of an acute marginal branch.  Once initial flow was established there was significant spasm in the mid distal RCA which ultimately improved to TIMI-3 flow following 100 mcg of intracoronary nitroglycerin and successful revascularization.  He was discharged 2 days later.  EF on echo was approximately 50% to 55% the day after his MI and he had normal wall motion.  He saw Elijah Course, PA-C for initial post hospital evaluation and amlodipine 2.5 mg mg was added to his regimen.  Over the past 3 months he has continued to do well.  He is now biking typically up to 2 hours at a time.  He denies any chest pain.  He denies any shortness of breath.  He denies palpitations.  He has been taking atorvastatin 80 mg.  His initial LDL during his hospitalization was 154 which improved to 64 with therapy.    I saw him for my initial office evaluation on July 01, 2018.  At that time he was remaining stable without recurrent chest pain symptomatology.  I  I saw him on November 12, 2018 for follow-up evaluation.  At the time he was exercising regularly and often may ride his bike for up to 2 to 3 hours at a time.  For the past several months, he has continued to be stable.  He was back at work as a Psychologist, prison and probation services  and at times has to travel to [place] and [place].  Despite the high altitude in [place] city he denied any chest tightness.  He continues to be on low-dose amlodipine 2.5 mg and lisinopril 2.5 mg daily post MI.  He continues to be on aspirin and Brilinta following his MI for DAPT treatment.  He is on atorvastatin 80 mg for hyperlipidemia.  He underwent recent laboratory 2 weeks ago.  Total cholesterol was 130, LDL  58, triglycerides 96 and HDL 53.   I saw him in November 2020 at which time he remained vascularly stable.    With the COVID-19 pandemic, he initially worked exclusively at home but was back in the office setting with very safe parameters.  He is not reinstituted any travel back to [place].  He denies chest pain PND orthopnea.  He denies palpitations presyncope or syncope.  He continues to ride his bike and remains asymptomatic.  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  He continue  to exercise regularly.  Over the Christmas holidays he cycled 5 out of 7 days and continues to do mountain biking typically for 1-1/2 to 2-1/2 hours at a time.  He remains asymptomatic without chest pain or shortness of breath.  He is followed by Elijah Baker at [facility].  He lives in [place].  He had laboratory in August 2021 which was stable.  Hemoglobin 15.3.  Total cholesterol was 162, HDL 69, LDL 76 and triglycerides 83.  Creatinine was 1.2.  LFTs were normal.  States his blood pressure at home typically is in the 128 to 132range with diastolics at 68-72.  At times he has been having difficulty getting his Brilinta renewed and he had been on low-dose Brilinta.  Apparently he ran out of this medication 1 week ago.  During that evaluation, I switched him to clopidogrel.  I last saw him on 11/08/2021.  Over the past year he has continued to be very active and rides his bicycle 5 to 7 days/week.  He had recently gone to [place] and was biking vigorous hills on consecutive days.  He subsequently developed soreness in his left calf and ultimately presented to the emergency room on March 04, 2022.  Doppler study was negative for DVT and he was incidentally noted to have a left popliteal cyst.  Presently, he denies any chest pain or shortness of breath.  He states his blood pressure at home typically has systolics in the 120s and diastolics between 75 and 83.  He had undergone laboratory by [facility] in January 2023 which showed total cholesterol at 140, triglycerides 74, HDL 49 and LDL 76.  He had normal renal function with creatinine 1.08.  LFTs were normal.  He admits to some easy bruisability.  He continues to be on aspirin and Plavix, lisinopril 5 mg and low-dose amlodipine 2.5 mg.  He is on atorvastatin 80 mg.  During that evaluation, I recommended reassessment of lipid studies and assessment of LP(a).  If LP(a) is elevated I recommended target LDL less than 50.  I also discussed alternative  therapy with discontinuance of aspirin and continue Plavix rather than discontinuing Plavix or slightly improved antiplatelet benefit with reduction in bruisability  Over the last several years he has continued to do well.  He continues to exercise vigorously on his bike.  He was evaluated on June 06, 2023 by Joni Reining, NP and at that time blood pressure was stable.  Presently, he remains asymptomatic with reference to chest pain or shortness of breath.  He sees Elijah Baker, [place] at [facility].  He had undergone recent laboratory on October 31, 2023.  LP(a) was 40.7.  PSA was normal at 1.7.  Hemoglobin A1c 5.7.  Total cholesterol 157, triglycerides  71, HDL 70, LDL 73.  Glucose 106.  Creatinine 1.11.  LFTs normal.  CBC stable.  He has noticed some blood pressure recent elevation and 1 recently seen by his primary provider it was recommended he increase lisinopril from 2.5 mg to 5 mg daily she has done for several days.  He continues to be on low-dose amlodipine 2.5 mg.  He is on aspirin 81 mg for antiplatelet benefit and continues to be on atorvastatin 80 mg daily for hyperlipidemia.  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  He is on the road traveling at least 66% of the time.  He admits to significantly increased recent work related stress.  He presents for evaluation.   Past Medical History:  Diagnosis Date   Hyperlipidemia    Hypertension    NSTEMI (non-ST elevated myocardial infarction) (HCC)    03/16/18 PCI/DES to mRCA, normal EF    Past Surgical History:  Procedure Laterality Date   CORONARY/GRAFT ACUTE MI REVASCULARIZATION N/A 03/16/2018   Procedure: Coronary/Graft Acute MI Revascularization;  Surgeon: Lennette Bihari, MD;  Location: [facility] INVASIVE CV LAB;  Service: Cardiovascular;  Laterality: N/A;   LEFT HEART CATH AND CORONARY ANGIOGRAPHY N/A 03/16/2018   Procedure:  LEFT HEART CATH AND CORONARY ANGIOGRAPHY;  Surgeon: Lennette Bihari, MD;  Location: [facility] INVASIVE CV LAB;  Service: Cardiovascular;  Laterality: N/A;    Current Medications: Outpatient Medications Prior to Visit  Medication Sig Dispense Refill   amLODipine (NORVASC) 2.5 MG tablet Take 1 tablet (2.5 mg total) by mouth daily. 90 tablet 3   aspirin 81 MG chewable tablet Chew 1 tablet (81 mg total) by mouth daily.     atorvastatin (LIPITOR) 80 MG tablet Take 1 tablet (80 mg total) by mouth daily. 90 tablet 3   ketoconazole (NIZORAL) 2 % shampoo Apply 1 Application topically 2 (two) times a week.     Lactobacillus (DIGESTIVE HEALTH PROBIOTIC PO) Take 1 tablet by mouth daily.     lisinopril (ZESTRIL) 5 MG tablet Take 5 mg by mouth daily.     LYCOPENE PO Take 1 tablet by mouth daily.     nitroGLYCERIN (NITROSTAT) 0.4 MG SL tablet Place 1 tablet (0.4 mg total) under the tongue every 5 (five) minutes x 3 doses as needed for chest pain. 25 tablet 0   lisinopril (ZESTRIL) 2.5 MG tablet Take 1 tablet (2.5 mg total) by mouth daily. 90 tablet 3   No facility-administered medications prior to visit.     Allergies:   Azithromycin, Bee venom, Ciprofloxacin, and Metronidazole   Social History   Socioeconomic History   Marital status: Married    Spouse name: Not on file   Number of children: Not on file   Years of education: Not on file   Highest education level: Not on file  Occupational History   Not on file  Tobacco Use   Smoking status: Never   Smokeless tobacco: Never  Vaping Use   Vaping status: Never Used  Substance and Sexual Activity   Alcohol use: Yes    Comment: 2 beers a day   Drug use: Never   Sexual activity: Yes  Other Topics Concern   Not on file  Social History Narrative   Not on file   Social Drivers of Health   Financial Resource Strain: Low Risk  (10/31/2023)   Received from [facility]   Overall Financial Resource Strain (  CARDIA)    Difficulty of Paying Living  Expenses: Not hard at all  Food Insecurity: No Food Insecurity (10/31/2023)   Received from [facility]   Hunger Vital Sign    Worried About Running Out of Food in the Last Year: Never true    Ran Out of Food in the Last Year: Never true  Transportation Needs: No Transportation Needs (10/31/2023)   Received from [facility] - Transportation    Lack of Transportation (Medical): No    Lack of Transportation (Non-Medical): No  Physical Activity: Sufficiently Active (09/01/2021)   Received from [facility]   Exercise Vital Sign    Days of Exercise per Week: 4 days    Minutes of Exercise per Session: 90 min  Stress: Stress Concern Present (09/01/2021)   Received from [facility], [facility] Occupational Health - Occupational Stress Questionnaire    Feeling of Stress : To some extent  Social Connections: Socially Integrated (10/07/2022)   [facility] from [facility]   Social Network    How would you rate your social network (family, work, friends)?: Good participation with social networks     Family History:  The patient's family history includes Parkinson's disease in his father.   ROS General: Negative; No fevers, chills, or night sweats;  HEENT: Negative; No changes in vision or hearing, sinus congestion, difficulty swallowing Pulmonary: Negative; No cough, wheezing, shortness of breath, hemoptysis Cardiovascular: Negative; No chest pain, presyncope, syncope, palpitations GI: Negative; No nausea, vomiting, diarrhea, or abdominal pain GU: Negative; No dysuria, hematuria, or difficulty voiding Musculoskeletal: Recent left calf discomfort Hematologic/Oncology: Negative; no easy bruising, bleeding Endocrine: Negative; no heat/cold intolerance; no diabetes Neuro: Negative; no changes in balance, headaches Skin: Negative; No rashes or skin lesions Psychiatric: Negative; No behavioral problems, depression Sleep:  Negative; No snoring, daytime sleepiness, hypersomnolence, bruxism, restless legs, hypnogognic hallucinations, no cataplexy Other comprehensive 14 point system review is negative.   PHYSICAL EXAM:   VS:  BP 138/88   Pulse 63   Ht 6\' 1"  (1.854 m)   Wt 196 lb 6.4 oz (89.1 kg)   SpO2 98%   BMI 25.91 kg/m     Repeat blood pressure by me was 142/80  Wt Readings from Last 3 Encounters:  11/08/23 196 lb 6.4 oz (89.1 kg)  06/06/23 191 lb 9.6 oz (86.9 kg)  03/08/22 189 lb 6.4 oz (85.9 kg)   General: Alert, oriented, no distress.  Skin: normal turgor, no rashes, warm and dry HEENT: Normocephalic, atraumatic. Pupils equal round and reactive to light; sclera anicteric; extraocular muscles intact;  Nose without nasal septal hypertrophy Mouth/Parynx benign; Mallinpatti scale 2 Neck: No JVD, no carotid bruits; normal carotid upstroke Lungs: clear to ausculatation and percussion; no wheezing or rales Chest wall: without tenderness to palpitation Heart: PMI not displaced, RRR, s1 s2 normal, 1/6 systolic murmur, no diastolic murmur, no rubs, gallops, thrills, or heaves Abdomen: soft, nontender; no hepatosplenomehaly, BS+; abdominal aorta nontender and not dilated by palpation. Back: no CVA tenderness Pulses 2+ Musculoskeletal: full range of motion, normal strength, no joint deformities Extremities: no clubbing cyanosis or edema, Homan's sign negative  Neurologic: grossly nonfocal; Cranial nerves grossly wnl Psychologic: Normal mood and affect     Studies/Labs Reviewed:   EKG Interpretation Date/Time:  Friday November 08 2023 08:03:03 EST Ventricular Rate:  63 PR Interval:  138 QRS Duration:  92 QT Interval:  378 QTC Calculation: 386 R Axis:  32  Text Interpretation: Normal sinus rhythm T wave abnormality, consider lateral ischemia When compared with ECG of 06-Jun-2023 13:56, PR interval has decreased Non-specific change in ST segment in Anterior leads T wave inversion now evident in  Anterior leads Confirmed by Elijah Baker ([id number]) on 11/08/2023 5:00:25 PM    March 08, 2022 ECG (independently read by me): Sinus Bradycardia at 57; nonspecificc T wave abnormality   September 21 2020 ECG (independently read by me): Sinus bradycardia 58 bpm.  Nonspecific T wave abnormality.  Normal intervals.  No ectopy  November12, 2021 ECG (independently read by me): Normal sinus rhythm at 68 bpm.  No ectopy.  Normal intervals.  February 2020 ECG (independently read by me): NSR 61; nonspecific T wave abnormality  October 2019 ECG (independently read by me): Sinus bradycardia at 55 bpm.  Inferolateral T wave abnormality.  Preserved R waves.  Normal intervals.  No ectopy.  Recent Labs:    Latest Ref Rng & Units 07/23/2019    8:27 AM 10/24/2018    8:22 AM 03/17/2018    7:02 AM  BMP  Glucose 65 - 99 mg/dL 94  97  604   BUN 8 - 27 mg/dL 14  15  10    Creatinine 0.76 - 1.27 mg/dL 5.40  9.81  1.91   BUN/Creat Ratio 10 - 24 12  13     Sodium 134 - 144 mmol/L 140  139  139   Potassium 3.5 - 5.2 mmol/L 5.4  5.0  4.2   Chloride 96 - 106 mmol/L 101  99  105   CO2 20 - 29 mmol/L 25  23  25    Calcium 8.6 - 10.2 mg/dL 47.8  29.5  8.8         Latest Ref Rng & Units 07/23/2019    8:27 AM 10/24/2018    8:22 AM 05/30/2018    8:04 AM  Hepatic Function  Total Protein 6.0 - 8.5 g/dL 6.5  6.5  6.4   Albumin 3.8 - 4.8 g/dL 5.0  4.7  4.7   AST 0 - 40 IU/L 25  27  31    ALT 0 - 44 IU/L 38  53  57   Alk Phosphatase 39 - 117 IU/L 59  64  57   Total Bilirubin 0.0 - 1.2 mg/dL 0.7  0.9  0.7   Bilirubin, Direct 0.00 - 0.40 mg/dL   6.21        Latest Ref Rng & Units 10/24/2018    8:22 AM 03/18/2018    3:36 AM 03/17/2018    7:02 AM  CBC  WBC 3.4 - 10.8 x10E3/uL 5.6  8.6  11.1   Hemoglobin 13.0 - 17.7 g/dL 30.8  65.7  84.6   Hematocrit 37.5 - 51.0 % 45.8  39.2  40.9   Platelets 150 - 450 x10E3/uL 273  213  228    Lab Results  Component Value Date   MCV 93 10/24/2018   MCV 95.4 03/18/2018   MCV 93.4 03/17/2018    Lab Results  Component Value Date   TSH 1.460 10/24/2018   No results found for: "HGBA1C"   BNP No results found for: "BNP"  ProBNP No results found for: "PROBNP"   Lipid Panel     Component Value Date/Time   CHOL 132 07/23/2019 0827   TRIG 75 07/23/2019 0827   HDL 58 07/23/2019 0827   CHOLHDL 2.3 07/23/2019 0827   CHOLHDL 3.2 03/16/2018 1753   VLDL 13 03/16/2018 1753  LDLCALC 59 07/23/2019 0827     RADIOLOGY: No results found.   Additional studies/ records that were reviewed today include:  I reviewed his hospitalization, catheterization report, laboratory and subsequent office evaluation. Recent laboratory was reviewed.  Prox RCA lesion is 30% stenosed. Prox RCA to Mid RCA lesion is 100% stenosed. Prox LAD lesion is 20% stenosed. Mid LAD to Dist LAD lesion is 5% stenosed. Post Atrio-1 lesion is 20% stenosed. Post Atrio-2 lesion is 20% stenosed. Post intervention, there is a 5% residual stenosis. A stent was successfully placed.   Acute coronary syndrome secondary to total occlusion of the mid RCA with faint left to right collaterals.   There is evidence for mild coronary calcification.  The LAD has 20% proximal narrowing.  The mid LAD has mid systolic bridging with narrowing up to at least 50% during systole; a small ramus intermediate vessel is normal; the circumflex vessel is normal.   RCA is a dominant vessel that a 30% proximal stenosis and was totally occluded in its mid segment proximal to the takeoff of an acute marginal branch.  Once initial flow was established there was significant spasm in the mid distal RCA which ultimately improved with TIMI-3 flow following 100 mcg of IC nitroglycerin and successful revascularization. There is mild 20% PLA stenoses segmentally.   Successful PCI to the totally occluded mid RCA with ultimate insertion of a 2.5 x 15 mm Resolute Onyx stent postdilated to 2.78 mm with the 100% occlusion being reduced to less than 5%  and with initial TIMI 0 flow being improved to TIMI-3 flow.   RECOMMENDATION: DAPT for minimum of 1 year.  High potency statin therapy will be started.  With the patient's  low blood pressure his daily lisinopril 20 mg dose will be reduced initially to 2.5 mg.  Ultimately plan to add possibly nitrates or amlodipine with muscle bridging.  The patient is bradycardic which may limit beta-blocker treatment.  We will plan echo Doppler in a.m. to assess LV function.  Continue postprocedure hydration.   Intervention    ASSESSMENT:    1. NSTEMI (non-ST elevated myocardial infarction) [facility]): March 16, 2018, DES stent to mid RCA   2. Coronary artery disease involving native coronary artery of native heart without angina pectoris   3. Essential hypertension   4. Hypercholesterolemia   5. Hyperlipidemia with target LDL less than 70   6. Medication management     PLAN:  Mr. Elijah Baker is a very healthy-appearing active 68 year old gentleman who suffered an acute coronary syndrome on March 16, 2018 secondary to total occlusion of the mid RCA with faint left-to-right collaterals.  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  He underwent successful intervention with ultimate insertion of a 2.5 x 15 mm Resolute Onyx DES stent postdilated to 2.78 mm with the 100% occlusion being reduced to less than 5% and with resolution of TIMI-3 flow.  His echo Doppler study done the following day showed normalization of LV function without wall motion abnormalities.  Since his acute coronary syndrome, he has continued to remain cardiovascularly stable.  He has been without anginal symptomatology and continues to exercise regularly with significant cycling.  He denies any exertional chest pain or exertional shortness of breath.  He had continued to be on aspirin and Brilinta for several years and subsequently was changed to aspirin/Plavix.  He admits to some mild  bruisability.  I last saw him in June 2023  at which time he remained stable without recurrent anginal symptomatology.  He has continued to ride his bike with strenuous effort often with significant hills.  I reviewed most recent laboratory.  LP(a) is normal at 40.7.  Most recent lipid panel showed total cholesterol at 73.  ECG's in the past had shown very mild T wave abnormality in leads V5 and V6.  His ECG today also shows T wave inversion in V4 with mild increase in V5-6 compared to previously.  This may be contributed by lead placement.  He remains entirely asymptomatic.  He has been noted to have mild recent blood pressure elevation which led to his primary provider increasing lisinopril from 2.5 mg to 5 mg.  With continued mild blood pressure elevation, I have recommended slight titration of amlodipine from 2.5 up to 5 mg daily.  I discussed with him ideal blood pressure less than 120/80 with commencement of stage I hypertension at 130/80.  He is now 6 years following his myocardial infarction and with his aggressive strenuous bike riding, I have suggested that he undergo an exercise Myoview perfusion study to make certain there is no potential ischemia contributing to the mild T wave abnormalities.  I will schedule this over the next month depending upon his work schedule.  I will contact him regarding the results.  I discussed my plans for future retirement.  I will see him in 3 months for follow-up evaluation.  Medication Adjustments/Labs and Tests Ordered: Current medicines are reviewed at length with the patient today.  Concerns regarding medicines are outlined above.  Medication changes, Labs and Tests ordered today are listed in the Patient Instructions below. Patient Instructions  Medication Instructions:  Increase the Amlodipine from 2.5mg  to 5mg  daily.  Take Lisinopril 5mg  daily.   *If you need a refill on your cardiac medications before your next appointment, please call your  pharmacy*   Lab Work: No labs were ordered during today's visit.    If you have labs (blood work) drawn today and your tests are completely normal, you will receive your results only by: MyChart Message (if you have MyChart) OR A paper copy in the mail If you have any lab test that is abnormal or we need to change your treatment, we will call you to review the results.   Testing/Procedures: Your physician has requested that you have en exercise stress myoview. For further information please visit [email or web address] Please follow instruction sheet, as given.    Follow-Up: At [facility], you and your health needs are our priority.  As part of our continuing mission to provide you with exceptional heart care, we have created designated Provider Care Teams.  These Care Teams include your primary Cardiologist (physician) and Advanced Practice Providers (APPs -  Physician Assistants and Nurse Practitioners) who all work together to provide you with the care you need, when you need it.  We recommend signing up for the patient portal called "MyChart".  Sign up information is provided on this After Visit Summary.  MyChart is used to connect with patients for Virtual Visits (Telemedicine).  Patients are able to view lab/test results, encounter notes, upcoming appointments, etc.  Non-urgent messages can be sent to your provider as well.   To learn more about what you can do with MyChart, go to [email or web address].    Your next appointment:   3 month(s)  Provider:   Nicki Guadalajara, MD     Other Instructions        1st Floor: -  Lobby - Registration  - Pharmacy  - Lab - Cafe   2nd Floor: - PV Lab - Diagnostic Testing (echo, CT, nuclear med)   3rd Floor: - Vacant   4th Floor: - TCTS (cardiothoracic surgery) - AFib Clinic - Structural Heart Clinic - Vascular Surgery  - Vascular Ultrasound   5th Floor: - [facility]Care Cardiology (general and EP) - Clinical Pharmacy  for coumadin, hypertension, lipid, weight-loss medications, and med management appointments      Valet parking services will be available as well.           Signed, Elijah Guadalajara, MD  11/08/2023 5:11 PM    [facility] [address], [address], [place], [place]  [address] Phone: [phone]

## 2023-12-26 ENCOUNTER — Telehealth (HOSPITAL_COMMUNITY): Payer: Self-pay | Admitting: *Deleted

## 2023-12-26 NOTE — Telephone Encounter (Signed)
 Left detailed instructions for MPI study.

## 2024-01-02 ENCOUNTER — Ambulatory Visit (HOSPITAL_COMMUNITY): Payer: 59 | Attending: Internal Medicine

## 2024-01-02 DIAGNOSIS — I10 Essential (primary) hypertension: Secondary | ICD-10-CM | POA: Diagnosis present

## 2024-01-02 DIAGNOSIS — I2510 Atherosclerotic heart disease of native coronary artery without angina pectoris: Secondary | ICD-10-CM | POA: Diagnosis present

## 2024-01-02 DIAGNOSIS — E785 Hyperlipidemia, unspecified: Secondary | ICD-10-CM

## 2024-01-02 DIAGNOSIS — E7800 Pure hypercholesterolemia, unspecified: Secondary | ICD-10-CM

## 2024-01-02 DIAGNOSIS — I214 Non-ST elevation (NSTEMI) myocardial infarction: Secondary | ICD-10-CM | POA: Diagnosis present

## 2024-01-02 LAB — MYOCARDIAL PERFUSION IMAGING
LV dias vol: 137 mL (ref 62–150)
LV sys vol: 75 mL
Nuc Stress EF: 45 %
Peak HR: 125 {beats}/min
Rest HR: 57 {beats}/min
Rest Nuclear Isotope Dose: 10.8 mCi
SDS: 0
SRS: 4
SSS: 4
Stress Nuclear Isotope Dose: 32.8 mCi
TID: 1.06

## 2024-01-02 MED ORDER — TECHNETIUM TC 99M TETROFOSMIN IV KIT
10.8000 | PACK | Freq: Once | INTRAVENOUS | Status: AC | PRN
  Administered 2024-01-02: 10.8 via INTRAVENOUS

## 2024-01-02 MED ORDER — TECHNETIUM TC 99M TETROFOSMIN IV KIT
32.8000 | PACK | Freq: Once | INTRAVENOUS | Status: AC | PRN
  Administered 2024-01-02: 32.8 via INTRAVENOUS

## 2024-01-02 MED ORDER — REGADENOSON 0.4 MG/5ML IV SOLN
0.4000 mg | Freq: Once | INTRAVENOUS | Status: AC
  Administered 2024-01-02: 0.4 mg via INTRAVENOUS

## 2024-01-03 ENCOUNTER — Encounter: Payer: Self-pay | Admitting: Cardiovascular Disease

## 2024-01-03 DIAGNOSIS — R931 Abnormal findings on diagnostic imaging of heart and coronary circulation: Secondary | ICD-10-CM

## 2024-01-03 DIAGNOSIS — R9389 Abnormal findings on diagnostic imaging of other specified body structures: Secondary | ICD-10-CM

## 2024-01-08 NOTE — Progress Notes (Signed)
 Patient has echo scheduled for 02/04/2024

## 2024-02-04 ENCOUNTER — Ambulatory Visit (HOSPITAL_COMMUNITY)
Admission: RE | Admit: 2024-02-04 | Discharge: 2024-02-04 | Disposition: A | Discharge status: Home / Self Care | Source: Ambulatory Visit | Attending: Cardiology | Admitting: Cardiology

## 2024-02-04 DIAGNOSIS — I119 Hypertensive heart disease without heart failure: Secondary | ICD-10-CM | POA: Insufficient documentation

## 2024-02-04 DIAGNOSIS — E785 Hyperlipidemia, unspecified: Secondary | ICD-10-CM | POA: Diagnosis not present

## 2024-02-04 DIAGNOSIS — I340 Nonrheumatic mitral (valve) insufficiency: Secondary | ICD-10-CM

## 2024-02-04 DIAGNOSIS — R9389 Abnormal findings on diagnostic imaging of other specified body structures: Secondary | ICD-10-CM

## 2024-02-04 DIAGNOSIS — R931 Abnormal findings on diagnostic imaging of heart and coronary circulation: Secondary | ICD-10-CM | POA: Insufficient documentation

## 2024-02-04 DIAGNOSIS — I252 Old myocardial infarction: Secondary | ICD-10-CM | POA: Diagnosis not present

## 2024-02-04 LAB — ECHOCARDIOGRAM COMPLETE
Area-P 1/2: 3.54 cm2
S' Lateral: 3.6 cm

## 2024-02-06 ENCOUNTER — Ambulatory Visit: Payer: Self-pay | Admitting: Cardiovascular Disease

## 2024-02-17 ENCOUNTER — Ambulatory Visit: Payer: 59 | Attending: Cardiovascular Disease | Admitting: Cardiovascular Disease

## 2024-02-17 ENCOUNTER — Encounter: Payer: Self-pay | Admitting: Cardiovascular Disease

## 2024-02-17 VITALS — BP 132/86 | HR 61 | Ht 74.0 in | Wt 193.4 lb

## 2024-02-17 DIAGNOSIS — I214 Non-ST elevation (NSTEMI) myocardial infarction: Secondary | ICD-10-CM | POA: Diagnosis not present

## 2024-02-17 DIAGNOSIS — I249 Acute ischemic heart disease, unspecified: Secondary | ICD-10-CM | POA: Diagnosis not present

## 2024-02-17 DIAGNOSIS — I10 Essential (primary) hypertension: Secondary | ICD-10-CM

## 2024-02-17 DIAGNOSIS — I2510 Atherosclerotic heart disease of native coronary artery without angina pectoris: Secondary | ICD-10-CM | POA: Diagnosis not present

## 2024-02-17 DIAGNOSIS — E7849 Other hyperlipidemia: Secondary | ICD-10-CM

## 2024-02-17 DIAGNOSIS — E785 Hyperlipidemia, unspecified: Secondary | ICD-10-CM

## 2024-02-17 MED ORDER — LISINOPRIL 10 MG PO TABS: 10.0000 mg | ORAL_TABLET | Freq: Every day | ORAL | 1 refills | Status: DC

## 2024-02-17 NOTE — Patient Instructions (Addendum)
 Medication Instructions:  INCREASE LISINOPRIL  TAKE 10 MG 1 TABLET DAILY *If you need a refill on your cardiac medications before your next appointment, please call your pharmacy*  Lab Work:  FASTING CBC, CMET, TSH, LIPIDs (Labs between August 2nd- September 2nd) If you have labs (blood work) drawn today and your tests are completely normal, you will receive your results only by: MyChart Message (if you have MyChart) OR A paper copy in the mail If you have any lab test that is abnormal or we need to change your treatment, we will call you to review the results.  Testing/Procedures: None  Follow-Up: At [facility], you and your health needs are our priority.  As part of our continuing mission to provide you with exceptional heart care, our providers are all part of one team.  This team includes your primary Cardiologist (physician) and Advanced Practice Providers or APPs (Physician Assistants and Nurse Practitioners) who all work together to provide you with the care you need, when you need it.  Your next appointment:   5 month(s)  Provider:   Randene Bustard, MD  We recommend signing up for the patient portal called "MyChart".  Sign up information is provided on this After Visit Summary.  MyChart is used to connect with patients for Virtual Visits (Telemedicine).  Patients are able to view lab/test results, encounter notes, upcoming appointments, etc.  Non-urgent messages can be sent to your provider as well.   To learn more about what you can do with MyChart, go to [email or web address].   Other Instructions Monitor Blood Pressure

## 2024-02-17 NOTE — Progress Notes (Unsigned)
 Cardiology Office Note    Date:  02/21/2024   ID:  Elijah Baker, Elijah Baker [birth year], MRN [id number]  PCP:  Medicine, [facility]  Cardiologist:  Magnus Schuller, MD   4 month  F/U office visit   History of Present Illness:  Elijah Baker is a 68 y.o. male who presents to the office for a 4 month follow-up cardiology evaluation.    [name] has a history of hypertension and remotely had been on cholesterol medication which was discontinued approximately 5 years prior to his initial evaluation with me.  He is an avid cyclist and exercises regularly.  On the morning of March 16, 2018 he developed new onset chest pressure.  His chest pressure wax and wane throughout the entire day and he ultimately presented to the hospital for persistent comfort in the evening.  He was found to have initial inferolateral T wave inversion and ongoing chest pain requiring increasing doses of nitroglycerin .  Initial troponin was positive at 4.26.  As result I took him urgently to the cardiac catheterization laboratory where he was found to have an acute coronary system  secondary to total occlusion of the mid RCA with very faint left to right collaterals.  There was mild coronary calcification.  The LAD had 20% proximal narrowing.  There was midsystolic bridging of the mid LAD with narrowing up to at least 50% during systole.  He had a normal ramus intermediate and circumflex vessel.  His RCA had 30% proximal stenosis and was totally occluded in its mid segment proximal to the takeoff of an acute marginal branch.  Once initial flow was established there was significant spasm in the mid distal RCA which ultimately improved to TIMI-3 flow following 100 mcg of intracoronary nitroglycerin  and successful revascularization.  He was discharged 2 days later.  EF on echo was approximately 50% to 55% the day after his MI and he had normal wall motion.  He saw [name], PA-C for initial post hospital  evaluation and amlodipine  2.5 mg mg was added to his regimen.  Over the past 3 months he has continued to do well.  He is now biking typically up to 2 hours at a time.  He denies any chest pain.  He denies any shortness of breath.  He denies palpitations.  He has been taking atorvastatin  80 mg.  His initial LDL during his hospitalization was 154 which improved to 64 with therapy.    I saw him for my initial office evaluation on July 01, 2018.  At that time he was remaining stable without recurrent chest pain symptomatology.  I  I saw him on November 12, 2018 for follow-up evaluation.  At the time he was exercising regularly and often may ride his bike for up to 2 to 3 hours at a time.  For the past several months, he has continued to be stable.  He was back at work as a Psychologist, prison and probation services  and at times has to travel to [place] and [place].  Despite the high altitude in [place] city he denied any chest tightness.  He continues to be on low-dose amlodipine  2.5 mg and lisinopril  2.5 mg daily post MI.  He continues to be on aspirin  and Brilinta  following his MI for DAPT treatment.  He is on atorvastatin  80 mg for hyperlipidemia.  He underwent recent laboratory 2 weeks ago.  Total cholesterol was 130, LDL 58, triglycerides 96 and HDL 53.  I saw him in November 2020 at which time he remained cardiovascularly stable.    With the COVID-19 pandemic, he initially worked exclusively at home but was back in the office setting with very safe parameters.  He is not reinstituted any travel back to [place].  He denies chest pain PND orthopnea.  He denies palpitations presyncope or syncope.  He continues to ride his bike and remains asymptomatic.  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  He continue to exercise regularly.  Over the  Christmas holidays he cycled 5 out of 7 days and continues to do mountain biking typically for 1-1/2 to 2-1/2 hours at a time.  He remains asymptomatic without chest pain or shortness of breath.  He is followed by [name] at [facility].  He lives in [place].  He had laboratory in August 2021 which was stable.  Hemoglobin 15.3.  Total cholesterol was 162, HDL 69, LDL 76 and triglycerides 83.  Creatinine was 1.2.  LFTs were normal.  States his blood pressure at home typically is in the 128 to 132range with diastolics at 68-72.  At times he has been having difficulty getting his Brilinta  renewed and he had been on low-dose Brilinta .  Apparently he ran out of this medication 1 week ago.  During that evaluation, I switched him to clopidogrel .  I saw him on 11/08/2021.  Over the past year he has continued to be very active and rides his bicycle 5 to 7 days/week.  He had recently gone to [place] and was biking vigorous hills on consecutive days.  He subsequently developed soreness in his left calf and ultimately presented to the emergency room on March 04, 2022.  Doppler study was negative for DVT and he was incidentally noted to have a left popliteal cyst.  Presently, he denies any chest pain or shortness of breath.  He states his blood pressure at home typically has systolics in the 120s and diastolics between 75 and 83.  He had undergone laboratory by [facility] in January 2023 which showed total cholesterol at 140, triglycerides 74, HDL 49 and LDL 76.  He had normal renal function with creatinine 1.08.  LFTs were normal.  He admits to some easy bruisability.  He continues to be on aspirin  and Plavix , lisinopril  5 mg and low-dose amlodipine  2.5 mg.  He is on atorvastatin  80 mg.  During that evaluation, I recommended reassessment of lipid studies and assessment of LP(a).  If LP(a) is elevated I recommended target LDL less than 50.  I also discussed alternative therapy with discontinuance of aspirin  and  continue Plavix  rather than discontinuing Plavix  or slightly improved antiplatelet benefit with reduction in bruisability  Over the last several years he has continued to do well.  He continues to exercise vigorously on his bike.  He was evaluated on June 06, 2023 by Friddie Jetty, NP and at that time blood pressure was stable.  When I last saw him on November 08, 2023 he remained asymptomatic with reference to chest pain or shortness of breath.  He sees [name], [place] at [facility].  He had undergone recent laboratory on October 31, 2023.  LP(a) was 40.7.  PSA was normal at 1.7.  Hemoglobin A1c 5.7.  Total cholesterol 157, triglycerides 71, HDL  70, LDL 73.  Glucose 106.  Creatinine 1.11.  LFTs normal.  CBC stable.  He has noticed some blood pressure recent elevation and 1 recently seen by his primary provider it was recommended he increase lisinopril  from 2.5 mg to 5 mg daily she has done for several days.  He continues to be on low-dose amlodipine  2.5 mg.  He is on aspirin  81 mg for antiplatelet benefit and continues to be on atorvastatin  80 mg daily for hyperlipidemia.  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  He is on the road traveling at least 66% of the time.  He admits to significantly increased recent work related stress.  During that evaluation, blood pressure was elevated and I suggested titration of amlodipine  to 5 mg.  I also recommended he undergo an exercise Myoview  study to make certain there was no potential ischemia contributing mild T wave abnormalities.  Since I last saw him, he underwent a Lexiscan  Myoview  study on January 02, 2024.  There was mild decrease in tracer activity in the inferior inferoseptal wall consistent with his prior nontransmural MI.  Calculated EF was 46% but visually it appeared greater and an echo Doppler study was recommended for reassessment  of LV function.  On Feb 04, 2024, 2D echo Doppler study showed EF estimated 50 to 55% without wall motion abnormality.  There was mild LVH.  He had a normal strain pattern.  There was borderline to very mild dilation of his ascending aorta and aortic root.  Presently, Mr. Denk feels well.  He continues to exercise regularly.  He continues to travel extensively with work.  He states his blood pressure at home typically is around 130 systolically.  He continues to be on amlodipine  5 mg, lisinopril  5 mg, baby aspirin , and takes atorvastatin  80 mg daily.  He continues to bike and may bike up to 20 to 35 miles at a time 4 days/week.  He presents for evaluation.   Past Medical History:  Diagnosis Date   Hyperlipidemia    Hypertension    NSTEMI (non-ST elevated myocardial infarction) (HCC)    03/16/18 PCI/DES to mRCA, normal EF    Past Surgical History:  Procedure Laterality Date   CORONARY/GRAFT ACUTE MI REVASCULARIZATION N/A 03/16/2018   Procedure: Coronary/Graft Acute MI Revascularization;  Surgeon: Millicent Ally, MD;  Location: [facility] INVASIVE CV LAB;  Service: Cardiovascular;  Laterality: N/A;   LEFT HEART CATH AND CORONARY ANGIOGRAPHY N/A 03/16/2018   Procedure: LEFT HEART CATH AND CORONARY ANGIOGRAPHY;  Surgeon: Millicent Ally, MD;  Location: [facility] INVASIVE CV LAB;  Service: Cardiovascular;  Laterality: N/A;    Current Medications: Outpatient Medications Prior to Visit  Medication Sig Dispense Refill   aspirin  81 MG chewable tablet Chew 1 tablet (81 mg total) by mouth daily.     atorvastatin  (LIPITOR ) 80 MG tablet Take 1 tablet (80 mg total) by mouth daily. 90 tablet 3   ketoconazole (NIZORAL) 2 % shampoo Apply 1 Application topically 2 (two) times a week.     Lactobacillus (DIGESTIVE HEALTH PROBIOTIC PO) Take 1 tablet by mouth daily.     LYCOPENE PO Take 1 tablet by mouth daily.     nitroGLYCERIN  (NITROSTAT ) 0.4 MG SL tablet Place 1 tablet (0.4 mg total) under the tongue every 5 (five)  minutes x 3 doses as needed for chest pain. 25 tablet 0   lisinopril  (ZESTRIL ) 5 MG tablet Take  5 mg by mouth daily.     amLODipine  (NORVASC ) 5 MG tablet Take 1 tablet (5 mg total) by mouth daily. 180 tablet 3   amLODipine  (NORVASC ) 2.5 MG tablet Take 1 tablet (2.5 mg total) by mouth daily. 90 tablet 3   No facility-administered medications prior to visit.     Allergies:   Azithromycin, Bee venom, Ciprofloxacin, and Metronidazole   Social History   Socioeconomic History   Marital status: Married    Spouse name: Not on file   Number of children: Not on file   Years of education: Not on file   Highest education level: Not on file  Occupational History   Not on file  Tobacco Use   Smoking status: Never   Smokeless tobacco: Never  Vaping Use   Vaping status: Never Used  Substance and Sexual Activity   Alcohol use: Yes    Comment: 2 beers a day   Drug use: Never   Sexual activity: Yes  Other Topics Concern   Not on file  Social History Narrative   Not on file   Social Drivers of Health   Financial Resource Strain: Low Risk  (10/31/2023)   Received from [facility]   Overall Financial Resource Strain (CARDIA)    Difficulty of Paying Living Expenses: Not hard at all  Food Insecurity: No Food Insecurity (10/31/2023)   Received from [facility]   Hunger Vital Sign    Worried About Running Out of Food in the Last Year: Never true    Ran Out of Food in the Last Year: Never true  Transportation Needs: No Transportation Needs (10/31/2023)   Received from [facility] - Transportation    Lack of Transportation (Medical): No    Lack of Transportation (Non-Medical): No  Physical Activity: Sufficiently Active (09/01/2021)   [facility] from [facility]   Exercise Vital Sign    Days of Exercise per Week: 4 days    Minutes of Exercise per Session: 90 min  Stress: Stress Concern Present (09/01/2021)   Received from [facility] Occup[facility] - Occupational Stress Questionnaire    Feeling of Stress : To some extent  Social Connections: Socially Integrated (10/07/2022)   [facility] from [facility]   Social Network    How would you rate your social network (family, work, friends)?: Good participation with social networks     Family History:  The patient's family history includes Parkinson's disease in his father.   ROS General: Negative; No fevers, chills, or night sweats;  HEENT: Negative; No changes in vision or hearing, sinus congestion, difficulty swallowing Pulmonary: Negative; No cough, wheezing, shortness of breath, hemoptysis Cardiovascular: Negative; No chest pain, presyncope, syncope, palpitations GI: Negative; No nausea, vomiting, diarrhea, or abdominal pain GU: Negative; No dysuria, hematuria, or difficulty voiding Musculoskeletal: Recent left calf discomfort Hematologic/Oncology: Negative; no easy bruising, bleeding Endocrine: Negative; no heat/cold intolerance; no diabetes Neuro: Negative; no changes in balance, headaches Skin: Negative; No rashes or skin lesions Psychiatric: Negative; No behavioral problems, depression Sleep: Negative; No snoring, daytime sleepiness, hypersomnolence, bruxism, restless legs, hypnogognic hallucinations, no cataplexy Other comprehensive 14 point system review is negative.   PHYSICAL EXAM:   VS:  BP 132/86   Pulse 61   Ht 6\' 2"  (1.88 m)   Wt 193 lb 6.4 oz (87.7 kg)   SpO2 98%   BMI 24.83 kg/m     Repeat blood pressure by  me was elevated at 160/84  Wt Readings from Last 3 Encounters:  02/17/24 193 lb 6.4 oz (87.7 kg)  01/02/24 196 lb (88.9 kg)  11/08/23 196 lb 6.4 oz (89.1 kg)   General: Alert, oriented, no distress.  Skin: normal turgor, no rashes, warm and dry HEENT: Normocephalic, atraumatic. Pupils equal round and reactive to light; sclera anicteric; extraocular muscles intact;  Nose without nasal septal  hypertrophy Mouth/Parynx benign; Mallinpatti scale 2 Neck: No JVD, no carotid bruits; normal carotid upstroke Lungs: clear to ausculatation and percussion; no wheezing or rales Chest wall: without tenderness to palpitation Heart: PMI not displaced, RRR, s1 s2 normal, 1/6 systolic murmur, no diastolic murmur, no rubs, gallops, thrills, or heaves Abdomen: soft, nontender; no hepatosplenomehaly, BS+; abdominal aorta nontender and not dilated by palpation. Back: no CVA tenderness Pulses 2+ Musculoskeletal: full range of motion, normal strength, no joint deformities Extremities: no clubbing cyanosis or edema, Homan's sign negative  Neurologic: grossly nonfocal; Cranial nerves grossly wnl Psychologic: Normal mood and affect    Studies/Labs Reviewed:   EKG Interpretation Date/Time:  Monday February 17 2024 08:15:40 EDT Ventricular Rate:  61 PR Interval:  180 QRS Duration:  88 QT Interval:  392 QTC Calculation: 394 R Axis:   30  Text Interpretation: Normal sinus rhythm Nonspecific T wave abnormality When compared with ECG of 08-Nov-2023 08:03, No significant change was found Confirmed by Magnus Schuller ([id number]) on 02/17/2024 8:35:23 AM    November 08, 2023 ECG (independently read by me): NSR at 63, T wave abnormality  March 08, 2022 ECG (independently read by me): Sinus Bradycardia at 57; nonspecificc T wave abnormality   September 21 2020 ECG (independently read by me): Sinus bradycardia 58 bpm.  Nonspecific T wave abnormality.  Normal intervals.  No ectopy  November12, 2021 ECG (independently read by me): Normal sinus rhythm at 68 bpm.  No ectopy.  Normal intervals.  February 2020 ECG (independently read by me): NSR 61; nonspecific T wave abnormality  October 2019 ECG (independently read by me): Sinus bradycardia at 55 bpm.  Inferolateral T wave abnormality.  Preserved R waves.  Normal intervals.  No ectopy.  Recent Labs:    Latest Ref Rng & Units 07/23/2019    8:27 AM 10/24/2018    8:22 AM  03/17/2018    7:02 AM  BMP  Glucose 65 - 99 mg/dL 94  97  604   BUN 8 - 27 mg/dL 14  15  10    Creatinine 0.76 - 1.27 mg/dL 5.40  9.81  1.91   BUN/Creat Ratio 10 - 24 12  13     Sodium 134 - 144 mmol/L 140  139  139   Potassium 3.5 - 5.2 mmol/L 5.4  5.0  4.2   Chloride 96 - 106 mmol/L 101  99  105   CO2 20 - 29 mmol/L 25  23  25    Calcium  8.6 - 10.2 mg/dL 47.8  29.5  8.8         Latest Ref Rng & Units 07/23/2019    8:27 AM 10/24/2018    8:22 AM 05/30/2018    8:04 AM  Hepatic Function  Total Protein 6.0 - 8.5 g/dL 6.5  6.5  6.4   Albumin 3.8 - 4.8 g/dL 5.0  4.7  4.7   AST 0 - 40 IU/L 25  27  31    ALT 0 - 44 IU/L 38  53  57   Alk Phosphatase 39 - 117 IU/L 59  64  57  Total Bilirubin 0.0 - 1.2 mg/dL 0.7  0.9  0.7   Bilirubin, Direct 0.00 - 0.40 mg/dL   8.11        Latest Ref Rng & Units 10/24/2018    8:22 AM 03/18/2018    3:36 AM 03/17/2018    7:02 AM  CBC  WBC 3.4 - 10.8 x10E3/uL 5.6  8.6  11.1   Hemoglobin 13.0 - 17.7 g/dL 91.4  78.2  95.6   Hematocrit 37.5 - 51.0 % 45.8  39.2  40.9   Platelets 150 - 450 x10E3/uL 273  213  228    Lab Results  Component Value Date   MCV 93 10/24/2018   MCV 95.4 03/18/2018   MCV 93.4 03/17/2018   Lab Results  Component Value Date   TSH 1.460 10/24/2018   No results found for: "HGBA1C"   BNP No results found for: "BNP"  ProBNP No results found for: "PROBNP"   Lipid Panel     Component Value Date/Time   CHOL 132 07/23/2019 0827   TRIG 75 07/23/2019 0827   HDL 58 07/23/2019 0827   CHOLHDL 2.3 07/23/2019 0827   CHOLHDL 3.2 03/16/2018 1753   VLDL 13 03/16/2018 1753   LDLCALC 59 07/23/2019 0827     RADIOLOGY: ECHOCARDIOGRAM COMPLETE Result Date: 02/04/2024    ECHOCARDIOGRAM REPORT   Patient Name:   TANIA PERROTT Date of Exam: 02/04/2024 Medical Rec #:  [id number]            Height:       73.0 in Accession #:    [id number]           Weight:       196.0 lb Date of Birth:  [birth year]           BSA:          2.133 m Patient  Age:    67 years             BP:           138/88 mmHg Patient Gender: M                    HR:           65 bpm. Exam Location:  [address] Procedure: 2D Echo, 3D Echo, Cardiac Doppler, Color Doppler and Strain Analysis            (Both Spectral and Color Flow Doppler were utilized during            procedure). Indications:    R93.1 Decreased Cardiac Ejection Fraction  History:        Patient has prior history of Echocardiogram examinations, most                 recent 03/17/2018. NSTEMI.; Risk Factors:Hypertension and                 Dyslipidemia.  Sonographer:    Cheri Coria Rodgers-Jones RDCS Referring Phys: [id number] Marion Rosenberry A Raine Blodgett IMPRESSIONS  1. Left ventricular ejection fraction, by estimation, is 50 to 55%. The left ventricle has low normal function. The left ventricle has no regional wall motion abnormalities. There is mild concentric left ventricular hypertrophy. Left ventricular diastolic parameters are indeterminate. The average left ventricular global longitudinal strain is -23.7 %. The global longitudinal strain is normal.  2. Right ventricular systolic function is normal. The right ventricular size is normal.  3. The mitral valve is normal in structure. Mild mitral valve regurgitation. No  evidence of mitral stenosis.  4. The aortic valve is normal in structure. Aortic valve regurgitation is not visualized. No aortic stenosis is present.  5. There is borderline dilatation of the aortic root, measuring 36 mm. There is mild dilatation of the ascending aorta, measuring 39 mm.  6. The inferior vena cava is normal in size with greater than 50% respiratory variability, suggesting right atrial pressure of 3 mmHg. FINDINGS  Left Ventricle: Left ventricular ejection fraction, by estimation, is 50 to 55%. The left ventricle has low normal function. The left ventricle has no regional wall motion abnormalities. The average left ventricular global longitudinal strain is -23.7 %. Strain was performed and the global  longitudinal strain is normal. The left ventricular internal cavity size was normal in size. There is mild concentric left ventricular hypertrophy. Left ventricular diastolic parameters are indeterminate. Right Ventricle: The right ventricular size is normal. No increase in right ventricular wall thickness. Right ventricular systolic function is normal. Left Atrium: Left atrial size was normal in size. Right Atrium: Right atrial size was normal in size. Pericardium: There is no evidence of pericardial effusion. Mitral Valve: The mitral valve is normal in structure. There is mild thickening of the mitral valve leaflet(s). Mild mitral valve regurgitation. No evidence of mitral valve stenosis. Tricuspid Valve: The tricuspid valve is normal in structure. Tricuspid valve regurgitation is not demonstrated. No evidence of tricuspid stenosis. Aortic Valve: The aortic valve is normal in structure. Aortic valve regurgitation is not visualized. No aortic stenosis is present. Pulmonic Valve: The pulmonic valve was normal in structure. Pulmonic valve regurgitation is not visualized. No evidence of pulmonic stenosis. Aorta: There is borderline dilatation of the aortic root, measuring 36 mm. There is mild dilatation of the ascending aorta, measuring 39 mm. Venous: The inferior vena cava is normal in size with greater than 50% respiratory variability, suggesting right atrial pressure of 3 mmHg. IAS/Shunts: No atrial level shunt detected by color flow Doppler. Additional Comments: 3D was performed not requiring image post processing on an independent workstation and was normal.  LEFT VENTRICLE PLAX 2D LVIDd:         5.10 cm   Diastology LVIDs:         3.60 cm   LV e' medial:    7.94 cm/s LV PW:         1.10 cm   LV E/e' medial:  10.0 LV IVS:        1.10 cm   LV e' lateral:   13.75 cm/s LVOT diam:     2.00 cm   LV E/e' lateral: 5.8 LV SV:         64 LV SV Index:   30        2D Longitudinal Strain LVOT Area:     3.14 cm  2D Strain  GLS (A4C):   -23.7 %                          2D Strain GLS (A3C):   -23.1 %                          2D Strain GLS (A2C):   -24.3 %                          2D Strain GLS Avg:     -23.7 %  3D Volume EF:                          3D EF:        50 %                          LV EDV:       171 ml                          LV ESV:       86 ml                          LV SV:        85 ml RIGHT VENTRICLE             IVC RV Basal diam:  4.70 cm     IVC diam: 1.50 cm RV S prime:     14.00 cm/s TAPSE (M-mode): 2.3 cm LEFT ATRIUM             Index        RIGHT ATRIUM           Index LA diam:        4.20 cm 1.97 cm/m   RA Area:     15.50 cm LA Vol (A2C):   65.3 ml 30.61 ml/m  RA Volume:   42.50 ml  19.92 ml/m LA Vol (A4C):   51.1 ml 23.95 ml/m LA Biplane Vol: 63.7 ml 29.86 ml/m  AORTIC VALVE LVOT Vmax:   87.20 cm/s LVOT Vmean:  57.700 cm/s LVOT VTI:    0.205 m  AORTA Ao Root diam: 3.60 cm Ao Asc diam:  3.90 cm MITRAL VALVE MV Area (PHT): 3.54 cm    SHUNTS MV Decel Time: 215 msec    Systemic VTI:  0.20 m MV E velocity: 79.40 cm/s  Systemic Diam: 2.00 cm MV A velocity: 88.05 cm/s MV E/A ratio:  0.90 Kardie Tobb DO Electronically signed by Jerryl Morin DO Signature Date/Time: 02/04/2024/2:04:31 PM    Final      Additional studies/ records that were reviewed today include:  I reviewed his hospitalization, catheterization report, laboratory and subsequent office evaluation. Recent laboratory was reviewed.  Prox RCA lesion is 30% stenosed. Prox RCA to Mid RCA lesion is 100% stenosed. Prox LAD lesion is 20% stenosed. Mid LAD to Dist LAD lesion is 5% stenosed. Post Atrio-1 lesion is 20% stenosed. Post Atrio-2 lesion is 20% stenosed. Post intervention, there is a 5% residual stenosis. A stent was successfully placed.   Acute coronary syndrome secondary to total occlusion of the mid RCA with faint left to right collaterals.   There is evidence for mild coronary calcification.  The LAD has  20% proximal narrowing.  The mid LAD has mid systolic bridging with narrowing up to at least 50% during systole; a small ramus intermediate vessel is normal; the circumflex vessel is normal.   RCA is a dominant vessel that a 30% proximal stenosis and was totally occluded in its mid segment proximal to the takeoff of an acute marginal branch.  Once initial flow was established there was significant spasm in the mid distal RCA which ultimately improved with TIMI-3 flow following 100 mcg of IC nitroglycerin  and successful revascularization. There is mild 20% PLA stenoses segmentally.   Successful PCI to the totally occluded mid  RCA with ultimate insertion of a 2.5 x 15 mm Resolute Onyx stent postdilated to 2.78 mm with the 100% occlusion being reduced to less than 5% and with initial TIMI 0 flow being improved to TIMI-3 flow.   RECOMMENDATION: DAPT for minimum of 1 year.  High potency statin therapy will be started.  With the patient's  low blood pressure his daily lisinopril  20 mg dose will be reduced initially to 2.5 mg.  Ultimately plan to add possibly nitrates or amlodipine  with muscle bridging.  The patient is bradycardic which may limit beta-blocker treatment.  We will plan echo Doppler in a.m. to assess LV function.  Continue postprocedure hydration.   Intervention    LEXISCAN  MYOVIEW : 01/02/2024   Lexiscan  stress EKGs shows now change from baseline   Myovew scan shows thinning with decreased tracer activity in the inferior, inferoseptal walls (base, mid) consistent with probable soft tissue attenuation, cannot exclude subendocardial scar.  No ischemia   LVEF calculated at 46%, though visually appears greater.  Consider echo to further define wall motion, LVEF   Intermediate risk scan based on calculated LVEF   ECHO: 02/04/2024  1. Left ventricular ejection fraction, by estimation, is 50 to 55%. The  left ventricle has low normal function. The left ventricle has no regional  wall motion  abnormalities. There is mild concentric left ventricular  hypertrophy. Left ventricular  diastolic parameters are indeterminate. The average left ventricular  global longitudinal strain is -23.7 %. The global longitudinal strain is  normal.   2. Right ventricular systolic function is normal. The right ventricular  size is normal.   3. The mitral valve is normal in structure. Mild mitral valve  regurgitation. No evidence of mitral stenosis.   4. The aortic valve is normal in structure. Aortic valve regurgitation is  not visualized. No aortic stenosis is present.   5. There is borderline dilatation of the aortic root, measuring 36 mm.  There is mild dilatation of the ascending aorta, measuring 39 mm.   6. The inferior vena cava is normal in size with greater than 50%  respiratory variability, suggesting right atrial pressure of 3 mmHg.    ASSESSMENT:    1. NSTEMI (non-ST elevated myocardial infarction) [facility]): March 16, 2018, DES stent to mid RCA   2. Coronary artery disease involving native coronary artery of native heart without angina pectoris   3. Other hyperlipidemia   4. Essential hypertension   5. Hyperlipidemia with target LDL less than 70     PLAN:  Mr. Jarrette Dehner is a very healthy-appearing active 68 year old gentleman who suffered an acute coronary syndrome on March 16, 2018 secondary to total occlusion of the mid RCA with faint left-to-right collaterals.  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  He underwent successful intervention with ultimate insertion of a 2.5 x 15 mm Resolute Onyx DES stent postdilated to 2.78 mm with the 100% occlusion being reduced to less than 5% and with resolution of TIMI-3 flow.  His echo Doppler study done the following day showed normalization of LV function without wall motion abnormalities.  Since his acute coronary syndrome, he has continued to remain cardiovascularly stable.  He has been  without anginal symptomatology and continues to exercise regularly with significant cycling and typically may ride 20 to 35 miles at a time.  He denies any exertional chest pain or exertional shortness of breath.  He had continued to be on aspirin  and Brilinta   for several years and subsequently was changed to aspirin /Plavix .  When I saw him in June 2023 at which time he remained stable without recurrent anginal symptomatology.  He has continued to ride his bike with strenuous effort often with significant hills.  I reviewed most recent laboratory.  LP(a) is normal at 40.7.  Most recent lipid panel showed total cholesterol at 73.  ECG's in the past had shown very mild T wave abnormality in leads V5 and V6.  At his most recent prior evaluation with me in February 2025, his ECG showed mild T wave inversion in V4 with mild increase in V5-6 compared to previously.  This may be contributed by lead placement.  He remained entirely asymptomatic.  A Lexiscan  Myoview  study was done which showed mild decreased tracer in the inferior to inferoseptal wall consistent with his prior nontransmural infarct.  An echo Doppler study showed EF at 50 to 55% with normal wall motion.  His blood pressure today is elevated.  He has been on amlodipine  5 mg as well as lisinopril  5 mg.  I have recommended that he titrate lisinopril  to 10 mg daily.  He is followed at [facility] [place] [facility] who checks laboratory.  Currently he is on atorvastatin  80 mg.  He will be undergoing follow-up laboratory in August.  Prior LP(a) assessment was normal at 40.7.  He is tolerating aspirin  and Plavix .  If bruisability develops aspirin  can be discontinued. He is aware of my imminent retirement.  I will transition him to the cardiology and interventional care of Dr. Randene Bustard and plan for 52-month follow-up evaluation.  Medication Adjustments/Labs and Tests Ordered: Current medicines are reviewed at length with the patient today.   Concerns regarding medicines are outlined above.  Medication changes, Labs and Tests ordered today are listed in the Patient Instructions below. Patient Instructions  Medication Instructions:  INCREASE LISINOPRIL  TAKE 10 MG 1 TABLET DAILY *If you need a refill on your cardiac medications before your next appointment, please call your pharmacy*  Lab Work:  FASTING CBC, CMET, TSH, LIPIDs (Labs between August 2nd- September 2nd) If you have labs (blood work) drawn today and your tests are completely normal, you will receive your results only by: MyChart Message (if you have MyChart) OR A paper copy in the mail If you have any lab test that is abnormal or we need to change your treatment, we will call you to review the results.  Testing/Procedures: None  Follow-Up: At [facility], you and your health needs are our priority.  As part of our continuing mission to provide you with exceptional heart care, our providers are all part of one team.  This team includes your primary Cardiologist (physician) and Advanced Practice Providers or APPs (Physician Assistants and Nurse Practitioners) who all work together to provide you with the care you need, when you need it.  Your next appointment:   5 month(s)  Provider:   Randene Bustard, MD  We recommend signing up for the patient portal called "MyChart".  Sign up information is provided on this After Visit Summary.  MyChart is used to connect with patients for Virtual Visits (Telemedicine).  Patients are able to view lab/test results, encounter notes, upcoming appointments, etc.  Non-urgent messages can be sent to your provider as well.   To learn more about what you can do with MyChart, go to [email or web address].   Other Instructions Monitor Blood Pressure       Signed, Magnus Schuller, MD  02/21/2024 5:25 PM    [facility] [address], [address], [place], [place]  [address] Phone: [phone]

## 2024-02-21 ENCOUNTER — Encounter: Payer: Self-pay | Admitting: Cardiovascular Disease

## 2024-02-25 ENCOUNTER — Encounter: Payer: Self-pay | Admitting: Cardiovascular Disease

## 2024-02-25 NOTE — Telephone Encounter (Signed)
 Thanks from your patient

## 2024-06-11 ENCOUNTER — Other Ambulatory Visit: Payer: Self-pay | Admitting: Adult Health

## 2024-08-06 ENCOUNTER — Other Ambulatory Visit: Payer: Self-pay | Admitting: General Practice

## 2024-08-10 ENCOUNTER — Other Ambulatory Visit: Payer: Self-pay | Admitting: Adult Health

## 2024-08-10 MED ORDER — LISINOPRIL 10 MG PO TABS: 10.0000 mg | ORAL_TABLET | Freq: Every day | ORAL | 2 refills | Status: DC

## 2024-09-08 ENCOUNTER — Other Ambulatory Visit: Payer: Self-pay | Admitting: Adult Health

## 2024-09-22 ENCOUNTER — Ambulatory Visit: Attending: Cardiology | Admitting: Cardiology

## 2024-09-22 ENCOUNTER — Encounter: Payer: Self-pay | Admitting: Cardiology

## 2024-09-22 VITALS — BP 146/78 | HR 56 | Ht 74.0 in | Wt 198.0 lb

## 2024-09-22 DIAGNOSIS — I214 Non-ST elevation (NSTEMI) myocardial infarction: Secondary | ICD-10-CM

## 2024-09-22 DIAGNOSIS — E7849 Other hyperlipidemia: Secondary | ICD-10-CM

## 2024-09-22 DIAGNOSIS — Z9861 Coronary angioplasty status: Secondary | ICD-10-CM | POA: Diagnosis not present

## 2024-09-22 DIAGNOSIS — I10 Essential (primary) hypertension: Secondary | ICD-10-CM | POA: Diagnosis not present

## 2024-09-22 DIAGNOSIS — E785 Hyperlipidemia, unspecified: Secondary | ICD-10-CM | POA: Diagnosis not present

## 2024-09-22 DIAGNOSIS — I2510 Atherosclerotic heart disease of native coronary artery without angina pectoris: Secondary | ICD-10-CM | POA: Diagnosis not present

## 2024-09-22 MED ORDER — AMLODIPINE BESYLATE 10 MG PO TABS: 10.0000 mg | ORAL_TABLET | Freq: Every day | ORAL | 3 refills | Status: AC

## 2024-09-22 MED ORDER — NITROGLYCERIN 0.4 MG SL SUBL: 0.4000 mg | SUBLINGUAL_TABLET | SUBLINGUAL | 3 refills | Status: AC | PRN

## 2024-09-22 MED ORDER — ATORVASTATIN CALCIUM 80 MG PO TABS: 80.0000 mg | ORAL_TABLET | Freq: Every day | ORAL | 3 refills | Status: AC

## 2024-09-22 MED ORDER — LISINOPRIL 10 MG PO TABS: 10.0000 mg | ORAL_TABLET | Freq: Every day | ORAL | 3 refills | Status: AC

## 2024-09-22 MED ORDER — EZETIMIBE 10 MG PO TABS: 10.0000 mg | ORAL_TABLET | Freq: Every day | ORAL | 3 refills | Status: AC

## 2024-09-22 NOTE — Patient Instructions (Addendum)
 Medication Instructions:   Increase amlodipine  10 mg daily  Zetia  10 mg daily    *If you need a refill on your cardiac medications before your next appointment, please call your pharmacy*   Lab Work:  If you have labs (blood work) drawn today and your tests are completely normal, you will receive your results only by: MyChart Message (if you have MyChart) OR A paper copy in the mail If you have any lab test that is abnormal or we need to change your treatment, we will call you to review the results.   Testing/Procedures:  Not needed  Follow-Up: At [facility], you and your health needs are our priority.  As part of our continuing mission to provide you with exceptional heart care, we have created designated Provider Care Teams.  These Care Teams include your primary Cardiologist (physician) and Advanced Practice Providers (APPs -  Physician Assistants and Nurse Practitioners) who all work together to provide you with the care you need, when you need it.     Your next appointment:   1 year(s)  The format for your next appointment:   In Person  Provider:   Alm Clay, MD   Other Instructions

## 2024-09-22 NOTE — Progress Notes (Signed)
 " Cardiology Office Note:  .   Date:  09/27/2024  ID:  Elspeth Gladis Field, DOB November 25, 1955, MRN [id number] PCP: Medicine, [facility] Providers Cardiologist:  Alm Clay, MD     Chief Complaint  Patient presents with   Follow-up    49-month follow-up.  Doing well.  Here to establish new cardiologist.   Coronary Artery Disease    History of inferior STEMI with mildly reduced EF but no significant symptoms.    Patient Profile: .     Cali Cuartas is a very healthy/vigorous 69 y.o. male  with a PMH notable for CAD-inferior non-STEMI  who presents here for 36-month follow-up at the request of Medicine, [facility].  PMH CAD/Inferior ~NSTEMI June 2019: 100 % RCA-DES PCI Mild ICM on Myoview  but low normal EF on echo HLD HTN     Elkin Belfield was last seen on February 17, 2024 by Dr. Burnard prior to his retirement.  He was doing well.  Exercising regularly with no major issues.  Extensive traveling with work and exercise.  Blood pressure is usually low but better at home.  Was on 5 mg lisinopril  and 5 mg Motifene.  Still biking up to 20 to 35 miles a day a day at least 4 times a week.  They reviewed his echocardiogram and Myoview . => Lisinopril  increased to 10 mg daily and plan for 107-month follow-up.  Was set up to see me in follow-up.  Subjective  Discussed the use of AI scribe software for clinical note transcription with the patient, who gave verbal consent to proceed.  History of Present Illness Jadin Creque is a 69 year old male with coronary artery disease who presents for a routine follow-up visit. He was referred by Dr. Burnard for continued cardiac care following his retirement.  He experienced a myocardial infarction in June 2019, resulting in a 100% blockage of the proximal to mid right coronary artery (RCA), with additional lesions in the proximal RCA, proximal left anterior descending (LAD), mid  distal LAD, and right posterior descending artery. A stent was placed in the RCA, which was post-dilated to 2.8 mm. He has not undergone another catheterization since then.  In April 2025, he underwent a stress test using Lexiscan , which he found unpleasant. The test showed a pump function of 46% and evidence of a prior heart attack in the inferior and infraseptal wall, but no ischemia. An echocardiogram showed a pump function of 50-55%.  He engages in regular physical activity, including road and intense mountain biking. He reports no falls during biking and continues to ride three times a week, although it is challenging during this time of year.  No shortness of breath when lying flat, swelling in the legs, heart racing, skipping, or flipping, and no history of strokes or passing out spells. He also reports no pressure or tightness during physical activities.  His current medications include amlodipine  5 mg, lisinopril  10 mg, atorvastatin  80 mg, and aspirin  81 mg. He takes his medications early in the morning around 5 o'clock.  His blood pressure is typically around 138/70s, but was 142/74 at a recent appointment. His cholesterol levels show a total cholesterol of 165 and LDL of 75, which he notes has always been in the 70-80 range.   Objective   Medications: Aspirin  81 mg daily Atorvastatin  80 mg daily Lisinopril  10 mg daily, amlodipine  5 mg daily As needed NTG-not using  Studies Reviewed: .  EKG Interpretation Date/Time:  Tuesday September 22 2024 14:41:53 EST Ventricular Rate:  56 PR Interval:  188 QRS Duration:  90 QT Interval:  406 QTC Calculation: 391 R Axis:   7  Text Interpretation: Sinus bradycardia Nonspecific T wave abnormality When compared with ECG of 17-Feb-2024 08:15, No significant change was found Confirmed by Anner Lenis ([id number]) on 09/22/2024 3:10:09 PM    [facility]+Non-HDL Cholesterol Component 98/94/73 10/31/23 10/08/22  Cholesterol, Total 165 157 162   Triglycerides 112 71 99  HDL 70 70 64  VLDL Cholesterol Cal 20 14 18   LDL 75 73 80  Comprehensive Metabolic Panel Component 09/21/24 10/31/23 10/08/22  Glucose 98 106 High  101 High   BUN 15 12 13   Creatinine 1.11 1.11 1.10  eGFR 73 74 75  BUN/Creatinine Ratio 14 11 12   Sodium 140 141 140  Potassium 4.8 4.6 4.6  Chloride 102 103 102  CO2 23 21 25   CALCIUM  10.0 10.0 10.1  Total Protein 6.7 6.7 6.5  Albumin, Serum 4.8 4.8 4.7  Globulin, Total 1.9 1.9 1.8  Total Bilirubin 0.9 0.7 0.7  Alkaline Phosphatase 63 65 64  AST 24 21 20   ALT (SGPT) 33 28 24   Hemoglobin 16.1 16.6  Hematocrit 47.7 47.0   Platelet Count 276 288   Results Labs Total cholesterol (09/21/2024): 165 LDL (09/21/2024): 75  Diagnostic Lexiscan  Myocardial Perfusion Imaging (01/02/2024): Normal LV size and mildly reduced function-EF 46%. Prior myocardial infarction-prior infarct in the inferior and inferoseptal wall. No ischemia.   Echocardiogram (02/04/2024): LVEF 50-55%. No significant regional wall motion abnormalities. Mild mitral regurgitation. No mitral stenosis. Normal aortic valve. Normal aortic root size. Normal right atrial size. Normal right-sided pressures.  Cardiac catheterization (03/16/2018): Dominant RCA.  30% prox RCA & 100% prox to mid RCA occlusion (collateral L-R flow from LCx), rPAV 10-20%; 20% proximal LAD, mid to distal LAD ~5%, => DES PCI mRCA (Resolute Onyx 2.5 x 15-2.75) w/ residual 5% stenosis.    Risk Assessment/Calculations:     HYPERTENSION CONTROL Vitals:   09/22/24 1438   BP: (!) 159/84 (!) 146/78    The patient's blood pressure is elevated above target today.  In order to address the patient's elevated BP: A current anti-hypertensive medication was adjusted today. (Increase existing dose of Norvasc  to 10 mg)        Physical Exam:   VS:  BP (!) 146/78   Pulse (!) 56   Ht 6' 2 (1.88 m)   Wt 198 lb (89.8 kg)   SpO2 94%   BMI 25.42 kg/m    Wt Readings from Last 3  Encounters:  09/22/24 198 lb (89.8 kg)  02/17/24 193 lb 6.4 oz (87.7 kg)  01/02/24 196 lb (88.9 kg)    GEN: Well nourished, well groomed; in no acute distress; very healthy and robust appearing. NECK: No JVD; No carotid bruits CARDIAC: Normal S1, S2; RRR, no murmurs, rubs, gallops RESPIRATORY:  Clear to auscultation without rales, wheezing or rhonchi ; nonlabored, good air movement. ABDOMEN: Soft, non-tender, non-distended EXTREMITIES:  No edema; No deformity      ASSESSMENT AND PLAN: .    CAD S/P DES PCI to RCA Coronary artery disease with myocardial infarction in June 2019, treated with s DES PCI-RCA.  Recent Myoview  Stress Test in April 2025 with nonischemic with inferior inferoseptal wall infarct, but no RWMA noted on echo with low normal EF 50 to 55%.   He remains very active and vigorous with his exercise-aggressive road  and mountain biking with no major issues. No symptoms of angina, dyspnea, or arrhythmias. Engages in vigorous exercise without limitations. - Continue current medications including aspirin  81 mg daily.  Okay to hold aspirin  5 to 7 days preop for surgeries or procedures. - Continue lisinopril  10 mg yearly and increase amlodipine  to 10 mg daily for additional blood pressure control. - LDL continues to be over 70 despite being on 80 mg atorvastatin , will continue atorvastatin  and add Zetia  10 mg daily.   Recheck lipids in 6 months.  Non-ST elevation (NSTEMI) myocardial infarction [facility]) Presented with non-STEMI in June 2019 found to have an occluded RCA suggesting probably delayed presentation of STEMI.  Myoview  does show some inferior scar, but No Significant Wall Motion Abnormality Noted on Echo. He is very active with a stable regimen.  No angina or heart failure symptoms.  Hyperlipidemia LDL goal <55 LDL cholesterol at 75 mg/dL, slightly above target. Likely genetic component as lifestyle factors are optimal. Currently on maximum dose of atorvastatin  80 mg. -  Added ezetimibe  10 mg daily to further lower LDL cholesterol. - Will recheck cholesterol levels in 6 months.  Primary hypertension Blood pressure slightly elevated at 159/84 down to 146/78 mmHg on recheck.  Currently on amlodipine  5 mg and lisinopril  10 mg. No beta blocker needed due to low resting heart rate. - Increased amlodipine  to 10 mg daily. - Continue lisinopril  10 mg daily.  Orders Placed This Encounter  Procedures   Lipid panel    Standing Status:   Future    Number of Occurrences:   1    Expected Date:   02/20/2025    Expiration Date:   09/22/2025    Has the patient fasted?:   Yes   EKG 12-Lead       Follow-Up: Return in about 1 year (around 09/22/2025) for Routine follow up with me, [employer].     Signed, Alm MICAEL Clay, MD, MS Alm Clay, M.D., M.S. Interventional Cardiologist  [facility] Pager # [phone]      "

## 2024-09-27 ENCOUNTER — Encounter: Payer: Self-pay | Admitting: Cardiology

## 2024-09-27 DIAGNOSIS — I10 Essential (primary) hypertension: Secondary | ICD-10-CM | POA: Insufficient documentation

## 2024-09-27 NOTE — Assessment & Plan Note (Signed)
 Coronary artery disease with myocardial infarction in June 2019, treated with s DES PCI-RCA.  Recent Myoview  Stress Test in April 2025 with nonischemic with inferior inferoseptal wall infarct, but no RWMA noted on echo with low normal EF 50 to 55%.   He remains very active and vigorous with his exercise-aggressive road and mountain biking with no major issues. No symptoms of angina, dyspnea, or arrhythmias. Engages in vigorous exercise without limitations. - Continue current medications including aspirin  81 mg daily.  Okay to hold aspirin  5 to 7 days preop for surgeries or procedures. - Continue lisinopril  10 mg yearly and increase amlodipine  to 10 mg daily for additional blood pressure control. - LDL continues to be over 70 despite being on 80 mg atorvastatin , will continue atorvastatin  and add Zetia  10 mg daily.   Recheck lipids in 6 months.

## 2024-09-27 NOTE — Assessment & Plan Note (Signed)
 LDL cholesterol at 75 mg/dL, slightly above target. Likely genetic component as lifestyle factors are optimal. Currently on maximum dose of atorvastatin  80 mg. - Added ezetimibe  10 mg daily to further lower LDL cholesterol. - Will recheck cholesterol levels in 6 months.

## 2024-09-27 NOTE — Assessment & Plan Note (Signed)
 Presented with non-STEMI in June 2019 found to have an occluded RCA suggesting probably delayed presentation of STEMI.  Myoview  does show some inferior scar, but No Significant Wall Motion Abnormality Noted on Echo. He is very active with a stable regimen.  No angina or heart failure symptoms.

## 2024-09-27 NOTE — Assessment & Plan Note (Addendum)
 Blood pressure slightly elevated at 159/84 down to 146/78 mmHg on recheck.  Currently on amlodipine  5 mg and lisinopril  10 mg. No beta blocker needed due to low resting heart rate. - Increased amlodipine  to 10 mg daily. - Continue lisinopril  10 mg daily.
